# Patient Record
Sex: Female | Born: 1950 | Race: White | Hispanic: No | Marital: Married | Smoking: Former smoker
Health system: Southern US, Community
[De-identification: ages and names within clinical notes are randomized; demographics above are authoritative.]

## PROBLEM LIST (undated history)

## (undated) DIAGNOSIS — F419 Anxiety disorder, unspecified: Secondary | ICD-10-CM

## (undated) DIAGNOSIS — M1990 Unspecified osteoarthritis, unspecified site: Secondary | ICD-10-CM

## (undated) DIAGNOSIS — G35 Multiple sclerosis: Secondary | ICD-10-CM

## (undated) DIAGNOSIS — F329 Major depressive disorder, single episode, unspecified: Secondary | ICD-10-CM

## (undated) DIAGNOSIS — F32A Depression, unspecified: Secondary | ICD-10-CM

## (undated) DIAGNOSIS — G709 Myoneural disorder, unspecified: Secondary | ICD-10-CM

## (undated) HISTORY — PX: TUBAL LIGATION: SHX77

## (undated) HISTORY — PX: DENTAL SURGERY: SHX609

## (undated) HISTORY — PX: BACK SURGERY: SHX140

## (undated) HISTORY — DX: Multiple sclerosis: G35

## (undated) HISTORY — PX: BREAST EXCISIONAL BIOPSY: SUR124

## (undated) HISTORY — PX: TONSILLECTOMY: SUR1361

---

## 2011-07-23 ENCOUNTER — Other Ambulatory Visit (HOSPITAL_COMMUNITY)
Admission: RE | Admit: 2011-07-23 | Discharge: 2011-07-23 | Disposition: A | Discharge status: Home / Self Care | Payer: Medicare Other | Source: Ambulatory Visit | Attending: Family Medicine | Admitting: Family Medicine

## 2011-07-23 DIAGNOSIS — Z124 Encounter for screening for malignant neoplasm of cervix: Secondary | ICD-10-CM | POA: Insufficient documentation

## 2011-07-30 ENCOUNTER — Ambulatory Visit
Admission: RE | Admit: 2011-07-30 | Discharge: 2011-07-30 | Disposition: A | Discharge status: Home / Self Care | Payer: Medicare Other | Source: Ambulatory Visit | Attending: Family Medicine | Admitting: Family Medicine

## 2011-07-30 ENCOUNTER — Other Ambulatory Visit: Payer: Self-pay | Admitting: Family Medicine

## 2011-07-30 DIAGNOSIS — Z1231 Encounter for screening mammogram for malignant neoplasm of breast: Secondary | ICD-10-CM

## 2013-07-27 ENCOUNTER — Other Ambulatory Visit: Payer: Self-pay

## 2013-07-27 DIAGNOSIS — Z1231 Encounter for screening mammogram for malignant neoplasm of breast: Secondary | ICD-10-CM

## 2013-08-11 ENCOUNTER — Ambulatory Visit
Admission: RE | Admit: 2013-08-11 | Discharge: 2013-08-11 | Disposition: A | Discharge status: Home / Self Care | Payer: Medicare Other | Source: Ambulatory Visit

## 2013-08-11 DIAGNOSIS — Z1231 Encounter for screening mammogram for malignant neoplasm of breast: Secondary | ICD-10-CM

## 2014-08-01 ENCOUNTER — Other Ambulatory Visit: Payer: Self-pay

## 2014-08-01 ENCOUNTER — Other Ambulatory Visit (HOSPITAL_COMMUNITY)
Admission: RE | Admit: 2014-08-01 | Discharge: 2014-08-01 | Disposition: A | Discharge status: Home / Self Care | Payer: Medicare Other | Source: Ambulatory Visit | Attending: Family Medicine | Admitting: Family Medicine

## 2014-08-01 ENCOUNTER — Other Ambulatory Visit: Payer: Self-pay | Admitting: Family Medicine

## 2014-08-01 DIAGNOSIS — Z1231 Encounter for screening mammogram for malignant neoplasm of breast: Secondary | ICD-10-CM

## 2014-08-01 DIAGNOSIS — Z01419 Encounter for gynecological examination (general) (routine) without abnormal findings: Secondary | ICD-10-CM | POA: Insufficient documentation

## 2014-08-03 LAB — CYTOLOGY - PAP

## 2014-08-17 ENCOUNTER — Ambulatory Visit
Admission: RE | Admit: 2014-08-17 | Discharge: 2014-08-17 | Disposition: A | Discharge status: Home / Self Care | Payer: Medicare Other | Source: Ambulatory Visit

## 2014-08-17 DIAGNOSIS — Z1231 Encounter for screening mammogram for malignant neoplasm of breast: Secondary | ICD-10-CM

## 2014-12-27 ENCOUNTER — Encounter: Payer: Self-pay | Admitting: *Deleted

## 2015-01-17 ENCOUNTER — Ambulatory Visit: Payer: Self-pay | Admitting: Neurology

## 2015-07-06 NOTE — H&P (Signed)
TOTAL KNEE ADMISSION H&P  Patient is being admitted for right total knee arthroplasty.  Subjective:  Chief Complaint:    Right knee primary OA / pain  HPI: Barbara Singleton, 65 y.o. female, has a history of pain and functional disability in the right knee due to arthritis and has failed non-surgical conservative treatments for greater than 12 weeks to includeNSAID's and/or analgesics, corticosteriod injections, use of assistive devices and activity modification.  Onset of symptoms was gradual, starting 2+ years ago with gradually worsening course since that time. The patient noted no past surgery on the right knee(s).  Patient currently rates pain in the right knee(s) at 9 out of 10 with activity. Patient has night pain, worsening of pain with activity and weight bearing, pain that interferes with activities of daily living, pain with passive range of motion, crepitus and joint swelling.  Patient has evidence of periarticular osteophytes and joint space narrowing by imaging studies.  There is no active infection.   Risks, benefits and expectations were discussed with the patient.  Risks including but not limited to the risk of anesthesia, blood clots, nerve damage, blood vessel damage, failure of the prosthesis, infection and up to and including death.  Patient understand the risks, benefits and expectations and wishes to proceed with surgery.   PCP: No primary care provider on file.  D/C Plans:      Home with HHPT  Post-op Meds:       No Rx given   Tranexamic Acid:      To be given - IV   Decadron:      Is to be given  FYI:     ASA  Norco    Past Medical History  Diagnosis Date  . Multiple sclerosis     Past Surgical History  Procedure Laterality Date  . Back surgery    . Cesarean section    . Dental surgery    . Tonsillectomy    . Tubal ligation      No prescriptions prior to admission   No Known Allergies   Social History  Substance Use Topics  . Smoking status: Not on  file  . Smokeless tobacco: Not on file  . Alcohol Use: Not on file       Review of Systems  Constitutional: Negative.   HENT: Negative.   Eyes: Negative.   Respiratory: Negative.   Cardiovascular: Negative.   Gastrointestinal: Negative.   Genitourinary: Negative.   Musculoskeletal: Positive for back pain and joint pain.  Skin: Negative.   Neurological: Negative.   Endo/Heme/Allergies: Negative.   Psychiatric/Behavioral: Negative.     Objective:  Physical Exam  Constitutional: She is oriented to person, place, and time. She appears well-developed.  HENT:  Head: Normocephalic.  Eyes: Pupils are equal, round, and reactive to light.  Neck: Neck supple. No JVD present. No tracheal deviation present. No thyromegaly present.  Cardiovascular: Normal rate, regular rhythm, normal heart sounds and intact distal pulses.   Respiratory: Effort normal and breath sounds normal. No stridor. No respiratory distress. She has no wheezes.  GI: Soft. There is no tenderness. There is no guarding.  Musculoskeletal:       Right knee: She exhibits decreased range of motion, swelling, abnormal alignment and bony tenderness. She exhibits no deformity, no laceration and no erythema. Tenderness found.  Lymphadenopathy:    She has no cervical adenopathy.  Neurological: She is alert and oriented to person, place, and time. A sensory deficit (occassionally tingling do to  her MS) is present.  Skin: Skin is warm and dry.  Psychiatric: She has a normal mood and affect.      Imaging Review Plain radiographs demonstrate severe degenerative joint disease of the right knee(s). The overall alignment is significant valgus. The bone quality appears to be good for age and reported activity level.  Assessment/Plan:  End stage arthritis, right knee   The patient history, physical examination, clinical judgment of the provider and imaging studies are consistent with end stage degenerative joint disease of the  right knee(s) and total knee arthroplasty is deemed medically necessary. The treatment options including medical management, injection therapy arthroscopy and arthroplasty were discussed at length. The risks and benefits of total knee arthroplasty were presented and reviewed. The risks due to aseptic loosening, infection, stiffness, patella tracking problems, thromboembolic complications and other imponderables were discussed. The patient acknowledged the explanation, agreed to proceed with the plan and consent was signed. Patient is being admitted for inpatient treatment for surgery, pain control, PT, OT, prophylactic antibiotics, VTE prophylaxis, progressive ambulation and ADL's and discharge planning. The patient is planning to be discharged home with home health services.      Anastasio Auerbach Renessa Wellnitz   PA-C  07/06/2015, 9:05 AM

## 2015-07-10 ENCOUNTER — Encounter (HOSPITAL_COMMUNITY): Payer: Self-pay

## 2015-07-10 ENCOUNTER — Encounter (HOSPITAL_COMMUNITY)
Admission: RE | Admit: 2015-07-10 | Discharge: 2015-07-10 | Disposition: A | Discharge status: Home / Self Care | Payer: Medicare Other | Source: Ambulatory Visit | Attending: Orthopedic Surgery | Admitting: Orthopedic Surgery

## 2015-07-10 DIAGNOSIS — M1711 Unilateral primary osteoarthritis, right knee: Secondary | ICD-10-CM | POA: Diagnosis not present

## 2015-07-10 DIAGNOSIS — Z0183 Encounter for blood typing: Secondary | ICD-10-CM | POA: Diagnosis not present

## 2015-07-10 DIAGNOSIS — Z01812 Encounter for preprocedural laboratory examination: Secondary | ICD-10-CM | POA: Insufficient documentation

## 2015-07-10 HISTORY — DX: Major depressive disorder, single episode, unspecified: F32.9

## 2015-07-10 HISTORY — DX: Depression, unspecified: F32.A

## 2015-07-10 HISTORY — DX: Myoneural disorder, unspecified: G70.9

## 2015-07-10 HISTORY — DX: Unspecified osteoarthritis, unspecified site: M19.90

## 2015-07-10 LAB — URINALYSIS, ROUTINE W REFLEX MICROSCOPIC
Bilirubin Urine: NEGATIVE
Glucose, UA: NEGATIVE mg/dL
Hgb urine dipstick: NEGATIVE
Ketones, ur: NEGATIVE mg/dL
LEUKOCYTES UA: NEGATIVE
Nitrite: NEGATIVE
PROTEIN: NEGATIVE mg/dL
Specific Gravity, Urine: 1.022 (ref 1.005–1.030)
pH: 7 (ref 5.0–8.0)

## 2015-07-10 LAB — BASIC METABOLIC PANEL
Anion gap: 9 (ref 5–15)
BUN: 17 mg/dL (ref 6–20)
CHLORIDE: 105 mmol/L (ref 101–111)
CO2: 26 mmol/L (ref 22–32)
CREATININE: 0.59 mg/dL (ref 0.44–1.00)
Calcium: 9.9 mg/dL (ref 8.9–10.3)
GFR calc Af Amer: >60 mL/min (ref >60)
GLUCOSE: 98 mg/dL (ref 65–99)
POTASSIUM: 4.3 mmol/L (ref 3.5–5.1)
Sodium: 140 mmol/L (ref 135–145)

## 2015-07-10 LAB — SURGICAL PCR SCREEN
MRSA, PCR: NEGATIVE
Staphylococcus aureus: POSITIVE — AB

## 2015-07-10 LAB — PROTIME-INR
INR: 1.07 (ref 0.00–1.49)
Prothrombin Time: 13.7 seconds (ref 11.6–15.2)

## 2015-07-10 LAB — CBC
HEMATOCRIT: 41 % (ref 36.0–46.0)
Hemoglobin: 13.1 g/dL (ref 12.0–15.0)
MCH: 28.9 pg (ref 26.0–34.0)
MCHC: 32 g/dL (ref 30.0–36.0)
MCV: 90.5 fL (ref 78.0–100.0)
Platelets: 250 10*3/uL (ref 150–400)
RBC: 4.53 MIL/uL (ref 3.87–5.11)
RDW: 14 % (ref 11.5–15.5)
WBC: 6 10*3/uL (ref 4.0–10.5)

## 2015-07-10 LAB — APTT: APTT: 26 s (ref 24–37)

## 2015-07-10 LAB — ABO/RH: ABO/RH(D): B POS

## 2015-07-10 NOTE — Pre-Procedure Instructions (Addendum)
EKG 06-30-15, Dr. Ehinger(Clearance note) with chart.Clearance note(Dr. Tinnie Gens with chart 07-05-15.

## 2015-07-10 NOTE — Patient Instructions (Signed)
Barbara Singleton  07/10/2015   Your procedure is scheduled on: 07-18-15   Report to [facility] Main  Entrance take [facility]  elevators to 3rd floor to  Short Stay Center at   1000 AM.  Call this number if you have problems the morning of surgery [phone]   Remember: ONLY 1 PERSON MAY GO WITH YOU TO SHORT STAY TO GET  READY MORNING OF YOUR SURGERY.  Do not eat food or drink liquids :After Midnight.     Take these medicines the morning of surgery with A SIP OF WATER:  Luvox. Lamictal. Latuda. Copaxone. Hydrocodone. DO NOT TAKE ANY DIABETIC MEDICATIONS DAY OF YOUR SURGERY                               You may not have any metal on your body including hair pins and              piercings  Do not wear jewelry, make-up, lotions, powders or perfumes, deodorant             Do not wear nail polish.  Do not shave  48 hours prior to surgery.              Men may shave face and neck.   Do not bring valuables to the hospital. [name] IS NOT             RESPONSIBLE   FOR VALUABLES.  Contacts, dentures or bridgework may not be worn into surgery.  Leave suitcase in the car. After surgery it may be brought to your room.     Patients discharged the day of surgery will not be allowed to drive home.  Name and phone number of your driver: Barbara Singleton -spouse [phone] home  Special Instructions: N/A              Please read over the following fact sheets you were given: _____________________________________________________________________             [facility] - Preparing for Surgery Before surgery, you can play an important role.  Because skin is not sterile, your skin needs to be as free of germs as possible.  You can reduce the number of germs on your skin by washing with CHG (chlorahexidine gluconate) soap before surgery.  CHG is an antiseptic cleaner which kills germs and bonds with the skin to continue killing germs even after washing. Please DO NOT use if you have an  allergy to CHG or antibacterial soaps.  If your skin becomes reddened/irritated stop using the CHG and inform your nurse when you arrive at Short Stay. Do not shave (including legs and underarms) for at least 48 hours prior to the first CHG shower.  You may shave your face/neck. Please follow these instructions carefully:  1.  Shower with CHG Soap the night before surgery and the  morning of Surgery.  2.  If you choose to wash your hair, wash your hair first as usual with your  normal  shampoo.  3.  After you shampoo, rinse your hair and body thoroughly to remove the  shampoo.                           4.  Use CHG as you would any other liquid soap.  You can apply chg directly  to the skin and wash                       Gently with a scrungie or clean washcloth.  5.  Apply the CHG Soap to your body ONLY FROM THE NECK DOWN.   Do not use on face/ open                           Wound or open sores. Avoid contact with eyes, ears mouth and genitals (private parts).                       Wash face,  Genitals (private parts) with your normal soap.             6.  Wash thoroughly, paying special attention to the area where your surgery  will be performed.  7.  Thoroughly rinse your body with warm water from the neck down.  8.  DO NOT shower/wash with your normal soap after using and rinsing off  the CHG Soap.                9.  Pat yourself dry with a clean towel.            10.  Wear clean pajamas.            11.  Place clean sheets on your bed the night of your first shower and do not  sleep with pets. Day of Surgery : Do not apply any lotions/deodorants the morning of surgery.  Please wear clean clothes to the hospital/surgery center.  FAILURE TO FOLLOW THESE INSTRUCTIONS MAY RESULT IN THE CANCELLATION OF YOUR SURGERY PATIENT SIGNATURE_________________________________  NURSE  SIGNATURE__________________________________  ________________________________________________________________________   Barbara Singleton  An incentive spirometer is a tool that can help keep your lungs clear and active. This tool measures how well you are filling your lungs with each breath. Taking long deep breaths may help reverse or decrease the chance of developing breathing (pulmonary) problems (especially infection) following:  A long period of time when you are unable to move or be active. BEFORE THE PROCEDURE   If the spirometer includes an indicator to show your best effort, your nurse or respiratory therapist will set it to a desired goal.  If possible, sit up straight or lean slightly forward. Try not to slouch.  Hold the incentive spirometer in an upright position. INSTRUCTIONS FOR USE   Sit on the edge of your bed if possible, or sit up as far as you can in bed or on a chair.  Hold the incentive spirometer in an upright position.  Breathe out normally.  Place the mouthpiece in your mouth and seal your lips tightly around it.  Breathe in slowly and as deeply as possible, raising the piston or the ball toward the top of the column.  Hold your breath for 3-5 seconds or for as long as possible. Allow the piston or ball to fall to the bottom of the column.  Remove the mouthpiece from your mouth and breathe out normally.  Rest for a few seconds and repeat Steps 1 through 7 at least 10 times every 1-2 hours when you are awake. Take your time and take a few normal breaths between deep breaths.  The spirometer may include an indicator to show your best effort. Use the indicator as a goal to work toward  during each repetition.  After each set of 10 deep breaths, practice coughing to be sure your lungs are clear. If you have an incision (the cut made at the time of surgery), support your incision when coughing by placing a pillow or rolled up towels firmly against it. Once  you are able to get out of bed, walk around indoors and cough well. You may stop using the incentive spirometer when instructed by your caregiver.  RISKS AND COMPLICATIONS  Take your time so you do not get dizzy or light-headed.  If you are in pain, you may need to take or ask for pain medication before doing incentive spirometry. It is harder to take a deep breath if you are having pain. AFTER USE  Rest and breathe slowly and easily.  It can be helpful to keep track of a log of your progress. Your caregiver can provide you with a simple table to help with this. If you are using the spirometer at home, follow these instructions: [place] IF:   You are having difficultly using the spirometer.  You have trouble using the spirometer as often as instructed.  Your pain medication is not giving enough relief while using the spirometer.  You develop fever of 100.5 F (38.1 C) or higher. SEEK IMMEDIATE MEDICAL CARE IF:   You cough up bloody sputum that had not been present before.  You develop fever of 102 F (38.9 C) or greater.  You develop worsening pain at or near the incision site. MAKE SURE YOU:   Understand these instructions.  Will watch your condition.  Will get help right away if you are not doing well or get worse. Document Released: 09/09/2006 Document Revised: 07/22/2011 Document Reviewed: 11/10/2006 Exit[employer] Patient Information 2014 [employer], [place].   ________________________________________________________________________  WHAT IS A BLOOD TRANSFUSION? Blood Transfusion Information  A transfusion is the replacement of blood or some of its parts. Blood is made up of multiple cells which provide different functions.  Red blood cells carry oxygen and are used for blood loss replacement.  White blood cells fight against infection.  Platelets control bleeding.  Plasma helps clot blood.  Other blood products are available for specialized needs, such as  hemophilia or other clotting disorders. BEFORE THE TRANSFUSION  Who gives blood for transfusions?   Healthy volunteers who are fully evaluated to make sure their blood is safe. This is blood bank blood. Transfusion therapy is the safest it has ever been in the practice of medicine. Before blood is taken from a donor, a complete history is taken to make sure that person has no history of diseases nor engages in risky social behavior (examples are intravenous drug use or sexual activity with multiple partners). The donor's travel history is screened to minimize risk of transmitting infections, such as malaria. The donated blood is tested for signs of infectious diseases, such as HIV and hepatitis. The blood is then tested to be sure it is compatible with you in order to minimize the chance of a transfusion reaction. If you or a relative donates blood, this is often done in anticipation of surgery and is not appropriate for emergency situations. It takes many days to process the donated blood. RISKS AND COMPLICATIONS Although transfusion therapy is very safe and saves many lives, the main dangers of transfusion include:   Getting an infectious disease.  Developing a transfusion reaction. This is an allergic reaction to something in the blood you were given. Every precaution is taken to  prevent this. The decision to have a blood transfusion has been considered carefully by your caregiver before blood is given. Blood is not given unless the benefits outweigh the risks. AFTER THE TRANSFUSION  Right after receiving a blood transfusion, you will usually feel much better and more energetic. This is especially true if your red blood cells have gotten low (anemic). The transfusion raises the level of the red blood cells which carry oxygen, and this usually causes an energy increase.  The nurse administering the transfusion will monitor you carefully for complications. HOME CARE INSTRUCTIONS  No special  instructions are needed after a transfusion. You may find your energy is better. Speak with your caregiver about any limitations on activity for underlying diseases you may have. SEEK MEDICAL CARE IF:   Your condition is not improving after your transfusion.  You develop redness or irritation at the intravenous (IV) site. SEEK IMMEDIATE MEDICAL CARE IF:  Any of the following symptoms occur over the next 12 hours:  Shaking chills.  You have a temperature by mouth above 102 F (38.9 C), not controlled by medicine.  Chest, back, or muscle pain.  People around you feel you are not acting correctly or are confused.  Shortness of breath or difficulty breathing.  Dizziness and fainting.  You get a rash or develop hives.  You have a decrease in urine output.  Your urine turns a dark color or changes to pink, red, or brown. Any of the following symptoms occur over the next 10 days:  You have a temperature by mouth above 102 F (38.9 C), not controlled by medicine.  Shortness of breath.  Weakness after normal activity.  The white part of the eye turns yellow (jaundice).  You have a decrease in the amount of urine or are urinating less often.  Your urine turns a dark color or changes to pink, red, or brown. Document Released: 04/26/2000 Document Revised: 07/22/2011 Document Reviewed: 12/14/2007 [facility] Patient Information 2014 [place], [place].  _______________________________________________________________________

## 2015-07-11 NOTE — Pre-Procedure Instructions (Addendum)
07-11-15 Positive Staph aureus by PCR- to use Mupirocin Ointment as directed, RX. Called to [facility] [address] [phone]. Fax note to Dr. Charlann Boxer office [phone].

## 2015-07-18 ENCOUNTER — Inpatient Hospital Stay (HOSPITAL_COMMUNITY)
Admission: RE | Admit: 2015-07-18 | Discharge: 2015-07-19 | DRG: 470 | Disposition: A | Discharge status: Home Health | Payer: Medicare Other | Source: Ambulatory Visit | Attending: Orthopedic Surgery | Admitting: Orthopedic Surgery

## 2015-07-18 ENCOUNTER — Encounter (HOSPITAL_COMMUNITY): Payer: Self-pay | Admitting: *Deleted

## 2015-07-18 ENCOUNTER — Encounter (HOSPITAL_COMMUNITY)
Admission: RE | Disposition: A | Discharge status: Home Health | Payer: Self-pay | Source: Ambulatory Visit | Attending: Orthopedic Surgery

## 2015-07-18 ENCOUNTER — Inpatient Hospital Stay (HOSPITAL_COMMUNITY): Payer: Medicare Other | Admitting: Certified Registered"

## 2015-07-18 DIAGNOSIS — Z96659 Presence of unspecified artificial knee joint: Secondary | ICD-10-CM

## 2015-07-18 DIAGNOSIS — Z96651 Presence of right artificial knee joint: Secondary | ICD-10-CM

## 2015-07-18 DIAGNOSIS — E663 Overweight: Secondary | ICD-10-CM | POA: Diagnosis present

## 2015-07-18 DIAGNOSIS — M659 Synovitis and tenosynovitis, unspecified: Secondary | ICD-10-CM | POA: Diagnosis present

## 2015-07-18 DIAGNOSIS — Z6828 Body mass index (BMI) 28.0-28.9, adult: Secondary | ICD-10-CM

## 2015-07-18 DIAGNOSIS — M1711 Unilateral primary osteoarthritis, right knee: Secondary | ICD-10-CM | POA: Diagnosis present

## 2015-07-18 DIAGNOSIS — G35 Multiple sclerosis: Secondary | ICD-10-CM | POA: Diagnosis present

## 2015-07-18 DIAGNOSIS — Z01812 Encounter for preprocedural laboratory examination: Secondary | ICD-10-CM

## 2015-07-18 DIAGNOSIS — M25561 Pain in right knee: Secondary | ICD-10-CM | POA: Diagnosis present

## 2015-07-18 HISTORY — PX: TOTAL KNEE ARTHROPLASTY: SHX125

## 2015-07-18 LAB — TYPE AND SCREEN
ABO/RH(D): B POS
Antibody Screen: NEGATIVE

## 2015-07-18 SURGERY — ARTHROPLASTY, KNEE, TOTAL
Anesthesia: Regional | Laterality: Right

## 2015-07-18 MED ORDER — LURASIDONE HCL 20 MG PO TABS
20.0000 mg | ORAL_TABLET | Freq: Every morning | ORAL | Status: DC
  Administered 2015-07-19: 20 mg via ORAL
  Filled 2015-07-18: qty 1

## 2015-07-18 MED ORDER — FENTANYL CITRATE (PF) 100 MCG/2ML IJ SOLN: 25.0000 ug | INTRAMUSCULAR | Status: DC | PRN

## 2015-07-18 MED ORDER — FENTANYL CITRATE (PF) 100 MCG/2ML IJ SOLN
25.0000 ug | INTRAMUSCULAR | Status: DC | PRN
  Administered 2015-07-18 (×3): 50 ug via INTRAVENOUS

## 2015-07-18 MED ORDER — KETOROLAC TROMETHAMINE 30 MG/ML IJ SOLN
INTRAMUSCULAR | Status: AC
Start: 2015-07-18 — End: 2015-07-18
  Filled 2015-07-18: qty 1

## 2015-07-18 MED ORDER — METHYLPHENIDATE HCL 10 MG PO TABS: 10.0000 mg | ORAL_TABLET | Freq: Two times a day (BID) | ORAL | Status: DC

## 2015-07-18 MED ORDER — KETOROLAC TROMETHAMINE 30 MG/ML IJ SOLN
INTRAMUSCULAR | Status: DC | PRN
  Administered 2015-07-18: 30 mg

## 2015-07-18 MED ORDER — MIDAZOLAM HCL 2 MG/2ML IJ SOLN
INTRAMUSCULAR | Status: AC
  Filled 2015-07-18: qty 2

## 2015-07-18 MED ORDER — PROPOFOL 10 MG/ML IV BOLUS
INTRAVENOUS | Status: AC
  Filled 2015-07-18: qty 80

## 2015-07-18 MED ORDER — PROPOFOL 500 MG/50ML IV EMUL
INTRAVENOUS | Status: DC | PRN
  Administered 2015-07-18: 75 ug/kg/min via INTRAVENOUS

## 2015-07-18 MED ORDER — SODIUM CHLORIDE 0.9 % IR SOLN
Status: DC | PRN
  Administered 2015-07-18: 1000 mL

## 2015-07-18 MED ORDER — HYDROCODONE-ACETAMINOPHEN 7.5-325 MG PO TABS
1.0000 | ORAL_TABLET | ORAL | Status: DC
  Administered 2015-07-18 – 2015-07-19 (×2): 2 via ORAL
  Filled 2015-07-18 (×4): qty 2

## 2015-07-18 MED ORDER — BUPIVACAINE-EPINEPHRINE (PF) 0.25% -1:200000 IJ SOLN
INTRAMUSCULAR | Status: AC
Start: 2015-07-18 — End: 2015-07-18
  Filled 2015-07-18: qty 30

## 2015-07-18 MED ORDER — 0.9 % SODIUM CHLORIDE (POUR BTL) OPTIME
TOPICAL | Status: DC | PRN
  Administered 2015-07-18: 1000 mL

## 2015-07-18 MED ORDER — BUPIVACAINE-EPINEPHRINE (PF) 0.5% -1:200000 IJ SOLN
INTRAMUSCULAR | Status: AC
  Filled 2015-07-18: qty 30

## 2015-07-18 MED ORDER — BUPIVACAINE-EPINEPHRINE (PF) 0.25% -1:200000 IJ SOLN
INTRAMUSCULAR | Status: DC | PRN
  Administered 2015-07-18: 30 mL

## 2015-07-18 MED ORDER — BISACODYL 10 MG RE SUPP: 10.0000 mg | Freq: Every day | RECTAL | Status: DC | PRN

## 2015-07-18 MED ORDER — FENTANYL CITRATE (PF) 100 MCG/2ML IJ SOLN
INTRAMUSCULAR | Status: DC | PRN
  Administered 2015-07-18 (×2): 50 ug via INTRAVENOUS

## 2015-07-18 MED ORDER — METOCLOPRAMIDE HCL 10 MG PO TABS: 5.0000 mg | ORAL_TABLET | Freq: Three times a day (TID) | ORAL | Status: DC | PRN

## 2015-07-18 MED ORDER — ONDANSETRON HCL 4 MG/2ML IJ SOLN
INTRAMUSCULAR | Status: DC | PRN
  Administered 2015-07-18: 4 mg via INTRAVENOUS

## 2015-07-18 MED ORDER — ALUM & MAG HYDROXIDE-SIMETH 200-200-20 MG/5ML PO SUSP: 30.0000 mL | ORAL | Status: DC | PRN

## 2015-07-18 MED ORDER — MAGNESIUM CITRATE PO SOLN: 1.0000 | Freq: Once | ORAL | Status: DC | PRN

## 2015-07-18 MED ORDER — HYDROMORPHONE HCL 1 MG/ML IJ SOLN
INTRAMUSCULAR | Status: AC
  Filled 2015-07-18: qty 1

## 2015-07-18 MED ORDER — PROMETHAZINE HCL 25 MG/ML IJ SOLN: 6.2500 mg | INTRAMUSCULAR | Status: DC | PRN

## 2015-07-18 MED ORDER — DOCUSATE SODIUM 100 MG PO CAPS
100.0000 mg | ORAL_CAPSULE | Freq: Two times a day (BID) | ORAL | Status: DC
  Administered 2015-07-18 – 2015-07-19 (×2): 100 mg via ORAL

## 2015-07-18 MED ORDER — TRANEXAMIC ACID 1000 MG/10ML IV SOLN
1000.0000 mg | Freq: Once | INTRAVENOUS | Status: AC
  Administered 2015-07-18: 1000 mg via INTRAVENOUS
  Filled 2015-07-18: qty 10

## 2015-07-18 MED ORDER — SODIUM CHLORIDE 0.9 % IJ SOLN
INTRAMUSCULAR | Status: AC
  Filled 2015-07-18: qty 50

## 2015-07-18 MED ORDER — FERROUS SULFATE 325 (65 FE) MG PO TABS
325.0000 mg | ORAL_TABLET | Freq: Three times a day (TID) | ORAL | Status: DC
  Administered 2015-07-19: 325 mg via ORAL
  Filled 2015-07-18 (×4): qty 1

## 2015-07-18 MED ORDER — CHLORHEXIDINE GLUCONATE 4 % EX LIQD: 60.0000 mL | Freq: Once | CUTANEOUS | Status: DC

## 2015-07-18 MED ORDER — HYDROMORPHONE HCL 1 MG/ML IJ SOLN
0.2500 mg | INTRAMUSCULAR | Status: DC | PRN
  Administered 2015-07-18: 0.25 mg via INTRAVENOUS
  Administered 2015-07-18: 0.5 mg via INTRAVENOUS
  Administered 2015-07-18: 0.25 mg via INTRAVENOUS
  Administered 2015-07-18 (×2): 0.5 mg via INTRAVENOUS

## 2015-07-18 MED ORDER — PHENOL 1.4 % MT LIQD: 1.0000 | OROMUCOSAL | Status: DC | PRN

## 2015-07-18 MED ORDER — LIP MEDEX EX OINT
TOPICAL_OINTMENT | CUTANEOUS | Status: AC
  Filled 2015-07-18: qty 7

## 2015-07-18 MED ORDER — LACTATED RINGERS IV SOLN
INTRAVENOUS | Status: DC
  Administered 2015-07-18: 1000 mL via INTRAVENOUS
  Administered 2015-07-18: 14:00:00 via INTRAVENOUS
  Administered 2015-07-18: 1000 mL via INTRAVENOUS

## 2015-07-18 MED ORDER — FENTANYL CITRATE (PF) 100 MCG/2ML IJ SOLN
INTRAMUSCULAR | Status: AC
  Filled 2015-07-18: qty 2

## 2015-07-18 MED ORDER — CEFAZOLIN SODIUM-DEXTROSE 2-3 GM-% IV SOLR
INTRAVENOUS | Status: AC
  Filled 2015-07-18: qty 50

## 2015-07-18 MED ORDER — HYDROMORPHONE HCL 1 MG/ML IJ SOLN
0.5000 mg | INTRAMUSCULAR | Status: DC | PRN
  Administered 2015-07-18: 1 mg via INTRAVENOUS
  Administered 2015-07-18: 0.5 mg via INTRAVENOUS
  Filled 2015-07-18: qty 1

## 2015-07-18 MED ORDER — DEXAMETHASONE SODIUM PHOSPHATE 10 MG/ML IJ SOLN
10.0000 mg | Freq: Once | INTRAMUSCULAR | Status: DC
  Filled 2015-07-18: qty 1

## 2015-07-18 MED ORDER — CELECOXIB 200 MG PO CAPS
200.0000 mg | ORAL_CAPSULE | Freq: Two times a day (BID) | ORAL | Status: DC
  Administered 2015-07-19: 200 mg via ORAL
  Filled 2015-07-18 (×3): qty 1

## 2015-07-18 MED ORDER — TRAZODONE HCL 50 MG PO TABS
50.0000 mg | ORAL_TABLET | Freq: Every day | ORAL | Status: DC
  Filled 2015-07-18: qty 1

## 2015-07-18 MED ORDER — ACETAMINOPHEN 325 MG PO TABS
ORAL_TABLET | ORAL | Status: DC | PRN
  Administered 2015-07-18: 1000 mg via ORAL

## 2015-07-18 MED ORDER — PROPOFOL 10 MG/ML IV BOLUS
INTRAVENOUS | Status: DC | PRN
  Administered 2015-07-18: 40 mg via INTRAVENOUS
  Administered 2015-07-18: 50 mg via INTRAVENOUS
  Administered 2015-07-18: 150 mg via INTRAVENOUS
  Administered 2015-07-18: 50 mg via INTRAVENOUS

## 2015-07-18 MED ORDER — DEXAMETHASONE SODIUM PHOSPHATE 10 MG/ML IJ SOLN
INTRAMUSCULAR | Status: AC
  Filled 2015-07-18: qty 1

## 2015-07-18 MED ORDER — SODIUM CHLORIDE 0.9 % IV SOLN
INTRAVENOUS | Status: DC
  Filled 2015-07-18 (×5): qty 1000

## 2015-07-18 MED ORDER — SODIUM CHLORIDE 0.9 % IJ SOLN
INTRAMUSCULAR | Status: DC | PRN
  Administered 2015-07-18: 30 mL

## 2015-07-18 MED ORDER — POLYETHYLENE GLYCOL 3350 17 G PO PACK
17.0000 g | PACK | Freq: Two times a day (BID) | ORAL | Status: DC
  Administered 2015-07-19: 17 g via ORAL

## 2015-07-18 MED ORDER — CEFAZOLIN SODIUM-DEXTROSE 2-3 GM-% IV SOLR
2.0000 g | Freq: Four times a day (QID) | INTRAVENOUS | Status: AC
  Administered 2015-07-18 (×2): 2 g via INTRAVENOUS
  Filled 2015-07-18 (×2): qty 50

## 2015-07-18 MED ORDER — METOCLOPRAMIDE HCL 5 MG/ML IJ SOLN: 5.0000 mg | Freq: Three times a day (TID) | INTRAMUSCULAR | Status: DC | PRN

## 2015-07-18 MED ORDER — LIDOCAINE HCL (CARDIAC) 20 MG/ML IV SOLN
INTRAVENOUS | Status: DC | PRN
  Administered 2015-07-18: 5 mL via INTRATRACHEAL

## 2015-07-18 MED ORDER — LAMOTRIGINE 100 MG PO TABS
100.0000 mg | ORAL_TABLET | Freq: Two times a day (BID) | ORAL | Status: DC
  Administered 2015-07-18 – 2015-07-19 (×2): 100 mg via ORAL
  Filled 2015-07-18 (×3): qty 1

## 2015-07-18 MED ORDER — PHENYLEPHRINE 40 MCG/ML (10ML) SYRINGE FOR IV PUSH (FOR BLOOD PRESSURE SUPPORT)
PREFILLED_SYRINGE | INTRAVENOUS | Status: AC
  Filled 2015-07-18: qty 10

## 2015-07-18 MED ORDER — METHYLPHENIDATE HCL 5 MG PO TABS
10.0000 mg | ORAL_TABLET | Freq: Two times a day (BID) | ORAL | Status: DC
  Administered 2015-07-18 – 2015-07-19 (×2): 10 mg via ORAL
  Filled 2015-07-18 (×2): qty 2

## 2015-07-18 MED ORDER — FLUVOXAMINE MALEATE 100 MG PO TABS
100.0000 mg | ORAL_TABLET | Freq: Three times a day (TID) | ORAL | Status: DC
  Administered 2015-07-18 – 2015-07-19 (×2): 100 mg via ORAL
  Filled 2015-07-18 (×4): qty 1

## 2015-07-18 MED ORDER — ACETAMINOPHEN 500 MG PO TABS
ORAL_TABLET | ORAL | Status: AC
  Filled 2015-07-18: qty 2

## 2015-07-18 MED ORDER — METHOCARBAMOL 1000 MG/10ML IJ SOLN
500.0000 mg | Freq: Four times a day (QID) | INTRAVENOUS | Status: DC | PRN
  Administered 2015-07-18: 500 mg via INTRAVENOUS
  Filled 2015-07-18 (×2): qty 5

## 2015-07-18 MED ORDER — DEXAMETHASONE SODIUM PHOSPHATE 10 MG/ML IJ SOLN
10.0000 mg | Freq: Once | INTRAMUSCULAR | Status: AC
  Administered 2015-07-18: 10 mg via INTRAVENOUS

## 2015-07-18 MED ORDER — STERILE WATER FOR IRRIGATION IR SOLN
Status: DC | PRN
  Administered 2015-07-18: 2000 mL

## 2015-07-18 MED ORDER — ASPIRIN EC 325 MG PO TBEC
325.0000 mg | DELAYED_RELEASE_TABLET | Freq: Two times a day (BID) | ORAL | Status: DC
  Administered 2015-07-19: 325 mg via ORAL
  Filled 2015-07-18 (×3): qty 1

## 2015-07-18 MED ORDER — MENTHOL 3 MG MT LOZG: 1.0000 | LOZENGE | OROMUCOSAL | Status: DC | PRN

## 2015-07-18 MED ORDER — BUPIVACAINE-EPINEPHRINE (PF) 0.5% -1:200000 IJ SOLN
INTRAMUSCULAR | Status: DC | PRN
  Administered 2015-07-18: 30 mL via PERINEURAL

## 2015-07-18 MED ORDER — CEFAZOLIN SODIUM-DEXTROSE 2-3 GM-% IV SOLR
2.0000 g | INTRAVENOUS | Status: AC
  Administered 2015-07-18: 2 g via INTRAVENOUS

## 2015-07-18 MED ORDER — MIDAZOLAM HCL 2 MG/2ML IJ SOLN
INTRAMUSCULAR | Status: DC | PRN
  Administered 2015-07-18 (×2): 2 mg via INTRAVENOUS

## 2015-07-18 MED ORDER — ONDANSETRON HCL 4 MG/2ML IJ SOLN
INTRAMUSCULAR | Status: AC
  Filled 2015-07-18: qty 2

## 2015-07-18 MED ORDER — ONDANSETRON HCL 4 MG PO TABS: 4.0000 mg | ORAL_TABLET | Freq: Four times a day (QID) | ORAL | Status: DC | PRN

## 2015-07-18 MED ORDER — ONDANSETRON HCL 4 MG/2ML IJ SOLN: 4.0000 mg | Freq: Four times a day (QID) | INTRAMUSCULAR | Status: DC | PRN

## 2015-07-18 MED ORDER — METHOCARBAMOL 500 MG PO TABS
500.0000 mg | ORAL_TABLET | Freq: Four times a day (QID) | ORAL | Status: DC | PRN
  Administered 2015-07-19: 500 mg via ORAL
  Filled 2015-07-18 (×2): qty 1

## 2015-07-18 SURGICAL SUPPLY — 46 items
BANDAGE ACE 6X5 VEL STRL LF (GAUZE/BANDAGES/DRESSINGS) ×3 IMPLANT
BLADE SAW SGTL 13.0X1.19X90.0M (BLADE) ×3 IMPLANT
BOWL SMART MIX CTS (DISPOSABLE) ×3 IMPLANT
CAPT KNEE TOTAL 3 ATTUNE ×3 IMPLANT
CEMENT HV SMART SET (Cement) ×6 IMPLANT
CLOTH BEACON ORANGE TIMEOUT ST (SAFETY) ×3 IMPLANT
CUFF TOURN SGL QUICK 34 (TOURNIQUET CUFF) ×2
CUFF TRNQT CYL 34X4X40X1 (TOURNIQUET CUFF) ×1 IMPLANT
DECANTER SPIKE VIAL GLASS SM (MISCELLANEOUS) ×3 IMPLANT
DRAPE U-SHAPE 47X51 STRL (DRAPES) ×3 IMPLANT
DRSG AQUACEL AG ADV 3.5X10 (GAUZE/BANDAGES/DRESSINGS) ×3 IMPLANT
DURAPREP 26ML APPLICATOR (WOUND CARE) ×6 IMPLANT
ELECT REM PT RETURN 9FT ADLT (ELECTROSURGICAL) ×3
ELECTRODE REM PT RTRN 9FT ADLT (ELECTROSURGICAL) ×1 IMPLANT
GLOVE BIOGEL M 7.0 STRL (GLOVE) ×3 IMPLANT
GLOVE BIOGEL PI IND STRL 7.5 (GLOVE) ×3 IMPLANT
GLOVE BIOGEL PI IND STRL 8.5 (GLOVE) ×1 IMPLANT
GLOVE BIOGEL PI INDICATOR 7.5 (GLOVE) ×6
GLOVE BIOGEL PI INDICATOR 8.5 (GLOVE) ×2
GLOVE ECLIPSE 8.0 STRL XLNG CF (GLOVE) ×3 IMPLANT
GLOVE ORTHO TXT STRL SZ7.5 (GLOVE) ×6 IMPLANT
GLOVE SURG SIGNA 7.5 PF LTX (GLOVE) ×3 IMPLANT
GLOVE SURG SS PI 7.5 STRL IVOR (GLOVE) ×3 IMPLANT
GOWN STRL REUS W/TWL LRG LVL3 (GOWN DISPOSABLE) ×6 IMPLANT
GOWN STRL REUS W/TWL XL LVL3 (GOWN DISPOSABLE) ×9 IMPLANT
HANDPIECE INTERPULSE COAX TIP (DISPOSABLE) ×2
LIQUID BAND (GAUZE/BANDAGES/DRESSINGS) ×3 IMPLANT
MANIFOLD NEPTUNE II (INSTRUMENTS) ×3 IMPLANT
NDL SAFETY ECLIPSE 18X1.5 (NEEDLE) ×1 IMPLANT
NEEDLE HYPO 18GX1.5 SHARP (NEEDLE) ×2
PACK TOTAL KNEE CUSTOM (KITS) ×3 IMPLANT
POSITIONER SURGICAL ARM (MISCELLANEOUS) ×3 IMPLANT
SET HNDPC FAN SPRY TIP SCT (DISPOSABLE) ×1 IMPLANT
SET PAD KNEE POSITIONER (MISCELLANEOUS) ×3 IMPLANT
SUCTION FRAZIER HANDLE 12FR (TUBING) ×2
SUCTION TUBE FRAZIER 12FR DISP (TUBING) ×1 IMPLANT
SUT MNCRL AB 4-0 PS2 18 (SUTURE) ×3 IMPLANT
SUT VIC AB 1 CT1 36 (SUTURE) ×3 IMPLANT
SUT VIC AB 2-0 CT1 27 (SUTURE) ×6
SUT VIC AB 2-0 CT1 TAPERPNT 27 (SUTURE) ×3 IMPLANT
SUT VLOC 180 0 24IN GS25 (SUTURE) ×3 IMPLANT
SYR 3ML LL SCALE MARK (SYRINGE) ×3 IMPLANT
SYR 50ML LL SCALE MARK (SYRINGE) ×3 IMPLANT
TRAY FOLEY W/METER SILVER 14FR (SET/KITS/TRAYS/PACK) ×3 IMPLANT
WRAP KNEE MAXI GEL POST OP (GAUZE/BANDAGES/DRESSINGS) ×3 IMPLANT
YANKAUER SUCT BULB TIP 10FT TU (MISCELLANEOUS) ×6 IMPLANT

## 2015-07-18 NOTE — Anesthesia Preprocedure Evaluation (Addendum)
Anesthesia Evaluation  Patient identified by MRN, date of birth, ID band Patient awake    Reviewed: Allergy & Precautions, NPO status , Patient's Chart, lab work & pertinent test results  Airway Mallampati: II  TM Distance: >3 FB Neck ROM: Full    Dental  (+) Teeth Intact, Dental Advisory Given   Pulmonary former smoker,    Pulmonary exam normal breath sounds clear to auscultation       Cardiovascular negative cardio ROS Normal cardiovascular exam Rhythm:Regular Rate:Normal     Neuro/Psych PSYCHIATRIC DISORDERS Depression Multiple sclerosis  Neuromuscular disease    GI/Hepatic negative GI ROS, Neg liver ROS,   Endo/Other  negative endocrine ROS  Renal/GU negative Renal ROS     Musculoskeletal  (+) Arthritis , Osteoarthritis,    Abdominal   Peds  Hematology negative hematology ROS (+)   Anesthesia Other Findings Day of surgery medications reviewed with the patient.  Reproductive/Obstetrics                          Anesthesia Physical Anesthesia Plan  ASA: III  Anesthesia Plan: General and Regional   Post-op Pain Management: GA combined w/ Regional for post-op pain   Induction: Intravenous  Airway Management Planned: LMA  Additional Equipment:   Intra-op Plan:   Post-operative Plan: Extubation in OR  Informed Consent: I have reviewed the patients History and Physical, chart, labs and discussed the procedure including the risks, benefits and alternatives for the proposed anesthesia with the patient or authorized representative who has indicated his/her understanding and acceptance.   Dental advisory given  Plan Discussed with: CRNA  Anesthesia Plan Comments: (Risks/benefits of general anesthesia discussed with patient including risk of damage to teeth, lips, gum, and tongue, nausea/vomiting, allergic reactions to medications, and the possibility of heart attack, stroke and death.   All patient questions answered.  Patient wishes to proceed.  Adductor canal nerve block plus GA)        Anesthesia Quick Evaluation

## 2015-07-18 NOTE — Interval H&P Note (Signed)
History and Physical Interval Note:  07/18/2015 11:51 AM  Barbara Singleton  has presented today for surgery, with the diagnosis of RIGHTKNEE OA  The various methods of treatment have been discussed with the patient and family. After consideration of risks, benefits and other options for treatment, the patient has consented to  Procedure(s): TOTAL KNEE ARTHROPLASTY (Right) as a surgical intervention .  The patient's history has been reviewed, patient examined, no change in status, stable for surgery.  I have reviewed the patient's chart and labs.  Questions were answered to the patient's satisfaction.     Shelda Pal

## 2015-07-18 NOTE — Anesthesia Procedure Notes (Addendum)
Anesthesia Regional Block:  Adductor canal block  Pre-Anesthetic Checklist: ,, timeout performed, Correct Patient, Correct Site, Correct Laterality, Correct Procedure, Correct Position, site marked, Risks and benefits discussed,  Surgical consent,  Pre-op evaluation,  At surgeon's request and post-op pain management  Laterality: Right  Prep: chloraprep       Needles:  Injection technique: Single-shot  Needle Type: Echogenic Needle     Needle Length: 10cm 10 cm Needle Gauge: 21 and 21 G    Additional Needles:  Procedures: ultrasound guided (picture in chart) Adductor canal block Narrative:  Injection made incrementally with aspirations every 5 mL.  Performed by: Personally  Anesthesiologist: Cecile Hearing  Additional Notes: No pain on injection. No increased resistance to injection. Injection made in 5cc increments.  Good needle visualization.  Patient tolerated procedure well.   Procedure Name: LMA Insertion Date/Time: 07/18/2015 12:34 PM Performed by: Donna Bernard Pre-anesthesia Checklist: Patient identified Patient Re-evaluated:Patient Re-evaluated prior to inductionOxygen Delivery Method: Circle system utilized Preoxygenation: Pre-oxygenation with 100% oxygen Intubation Type: IV induction Ventilation: Mask ventilation without difficulty LMA: LMA inserted LMA Size: 4.0 Number of attempts: 1 Dental Injury: Teeth and Oropharynx as per pre-operative assessment

## 2015-07-18 NOTE — Op Note (Signed)
NAME:  Barbara Singleton                      MEDICAL RECORD NO.:  [id number]                             FACILITY:  [facility]      PHYSICIAN:  Madlyn Frankel. Charlann Boxer, M.D.  DATE OF BIRTH:  1951-04-07      DATE OF PROCEDURE:  07/18/2015                                     OPERATIVE REPORT         PREOPERATIVE DIAGNOSIS:  Right knee osteoarthritis.      POSTOPERATIVE DIAGNOSIS:  Right knee osteoarthritis.      FINDINGS:  The patient was noted to have complete loss of cartilage and   bone-on-bone arthritis with associated osteophytes in the lateral and patellofemoral compartments of   the knee with a significant synovitis and associated effusion.      PROCEDURE:  Right total knee replacement.      COMPONENTS USED:  DePuy Attune rotating platform posterior stabilized knee   system, a size 6N femur, 5 tibia, size 7 mm PS AOX insert, and 35 anatomic patellar   button.      SURGEON:  Madlyn Frankel. Charlann Boxer, M.D.      ASSISTANT:  Skip Mayer, PA-C.      ANESTHESIA:  General and Regional.      SPECIMENS:  None.      COMPLICATION:  None.      DRAINS:  None  EBL: <50cc      TOURNIQUET TIME:   Total Tourniquet Time Documented: Thigh (Right) - 37 minutes Total: Thigh (Right) - 37 minutes  .      The patient was stable to the recovery room.      INDICATION FOR PROCEDURE:  Barbara Singleton is a 65 y.o. female patient of   mine.  The patient had been seen, evaluated, and treated conservatively in the   office with medication, activity modification, and injections.  The patient had   radiographic changes of bone-on-bone arthritis with endplate sclerosis and osteophytes noted.      The patient failed conservative measures including medication, injections, and activity modification, and at this point was ready for more definitive measures.   Based on the radiographic changes and failed conservative measures, the patient   decided to proceed with total knee replacement.  Risks of infection,   DVT,  component failure, need for revision surgery, postop course, and   expectations were all   discussed and reviewed.  Consent was obtained for benefit of pain   relief.      PROCEDURE IN DETAIL:  The patient was brought to the operative theater.   Once adequate anesthesia, preoperative antibiotics, 2 gm of Ancef, 1 gm of Tranexamic Acid, and 10 mg of Decadron administered, the patient was positioned supine with the right thigh tourniquet placed.  The  right lower extremity was prepped and draped in sterile fashion.  A time-   out was performed identifying the patient, planned procedure, and   extremity.      The right lower extremity was placed in the [facility] leg holder.  The leg was   exsanguinated, tourniquet elevated to 300 mmHg.  A midline incision was  made followed by median parapatellar arthrotomy.  Following initial   exposure, attention was first directed to the patella.  Precut   measurement was noted to be 23 mm.  I resected down to 14 mm and used a   35 patellar button to restore patellar height as well as cover the cut   surface.      The lug holes were drilled and a metal shim was placed to protect the   patella from retractors and saw blades.      At this point, attention was now directed to the femur.  The femoral   canal was opened with a drill, irrigated to try to prevent fat emboli.  An   intramedullary rod was passed at 5 degrees valgus, 9 mm of bone was   resected off the distal femur.  Following this resection, the tibia was   subluxated anteriorly.  Using the extramedullary guide, 4 mm of bone was resected off   the proximal lateral tibia.  We confirmed the gap would be   stable medially and laterally with a size 6 insert as well as confirmed   the cut was perpendicular in the coronal plane, checking with an alignment rod.      Once this was done, I sized the femur to be a size 6 in the anterior-   posterior dimension, chose a narrow component based on medial and    lateral dimension.  The size 6 rotation block was then pinned in   position anterior referenced using the C-clamp to set rotation.  The   anterior, posterior, and  chamfer cuts were made without difficulty nor   notching making certain that I was along the anterior cortex to help   with flexion gap stability.      The final box cut was made off the lateral aspect of distal femur.      At this point, the tibia was sized to be a size 5, the size 5 tray was   then pinned in position through the medial third of the tubercle,   drilled, and keel punched.  Trial reduction was now carried with a 6 femur,  5 tibia, a size 6 then 7 mm PS insert, and the 35 anatomic patella botton.  The knee was brought to   extension, full extension with good flexion stability with the patella   tracking through the trochlea without application of pressure.  Given   all these findings, the trial components removed.  Final components were   opened and cement was mixed.  The knee was irrigated with normal saline   solution and pulse lavage.  The synovial lining was   then injected with 0.25% Marcaine with epinephrine and 1 cc of Toradol,   total of 61 cc.      The knee was irrigated.  Final implants were then cemented onto clean and   dried cut surfaces of bone with the knee brought to extension with a size 7 mm trial insert.      Once the cement had fully cured, the excess cement was removed   throughout the knee.  I confirmed I was satisfied with the range of   motion and stability, and the final size 7 mm PS AOX insert was chosen.  It was   placed into the knee.      The tourniquet had been let down at 37 minutes.  No significant   hemostasis required.  The   extensor mechanism was then  reapproximated using #1 Vicryl and #0 V-lock sutures with the knee   in flexion.  The   remaining wound was closed with 2-0 Vicryl and running 4-0 Monocryl.   The knee was cleaned, dried, dressed sterilely using Dermabond  and   Aquacel dressing.  The patient was then   brought to recovery room in stable condition, tolerating the procedure   well.   Please note that Physician Assistant, Skip Mayer, PA-C, was present for the entirety of the case, and was utilized for pre-operative positioning, peri-operative retractor management, general facilitation of the procedure.  He was also utilized for primary wound closure at the end of the case.              Madlyn Frankel Charlann Boxer, M.D.    07/18/2015 3:44 PM

## 2015-07-18 NOTE — Transfer of Care (Signed)
Immediate Anesthesia Transfer of Care Note  Patient: Barbara Singleton  Procedure(s) Performed: Procedure(s): TOTAL KNEE ARTHROPLASTY (Right)  Patient Location: PACU  Anesthesia Type:General  Level of Consciousness: alert   Airway & Oxygen Therapy: Patient connected to face mask oxygen  Post-op Assessment: Post -op Vital signs reviewed and stable  Post vital signs: stable  Last Vitals:  Filed Vitals:   07/18/15 1013  BP: 128/63  Pulse: 61  Temp: 36.7 C  Resp: 16    Complications: No apparent anesthesia complications

## 2015-07-19 ENCOUNTER — Encounter (HOSPITAL_COMMUNITY): Payer: Self-pay | Admitting: Orthopedic Surgery

## 2015-07-19 DIAGNOSIS — E663 Overweight: Secondary | ICD-10-CM | POA: Diagnosis present

## 2015-07-19 LAB — BASIC METABOLIC PANEL
Anion gap: 9 (ref 5–15)
BUN: 15 mg/dL (ref 6–20)
CALCIUM: 9 mg/dL (ref 8.9–10.3)
CO2: 26 mmol/L (ref 22–32)
CREATININE: 0.62 mg/dL (ref 0.44–1.00)
Chloride: 102 mmol/L (ref 101–111)
GFR calc Af Amer: >60 mL/min (ref >60)
GFR calc non Af Amer: >60 mL/min (ref >60)
GLUCOSE: 122 mg/dL — AB (ref 65–99)
Potassium: 4.1 mmol/L (ref 3.5–5.1)
Sodium: 137 mmol/L (ref 135–145)

## 2015-07-19 LAB — CBC
HEMATOCRIT: 35.7 % — AB (ref 36.0–46.0)
Hemoglobin: 11.6 g/dL — ABNORMAL LOW (ref 12.0–15.0)
MCH: 29.4 pg (ref 26.0–34.0)
MCHC: 32.5 g/dL (ref 30.0–36.0)
MCV: 90.4 fL (ref 78.0–100.0)
Platelets: 215 10*3/uL (ref 150–400)
RBC: 3.95 MIL/uL (ref 3.87–5.11)
RDW: 13.9 % (ref 11.5–15.5)
WBC: 10.5 10*3/uL (ref 4.0–10.5)

## 2015-07-19 MED ORDER — METHOCARBAMOL 500 MG PO TABS: 500.0000 mg | ORAL_TABLET | Freq: Four times a day (QID) | ORAL | Status: DC | PRN

## 2015-07-19 MED ORDER — CELECOXIB 200 MG PO CAPS: 200.0000 mg | ORAL_CAPSULE | Freq: Two times a day (BID) | ORAL | Status: DC

## 2015-07-19 MED ORDER — OXYCODONE HCL 5 MG PO TABS: 5.0000 mg | ORAL_TABLET | ORAL | Status: DC | PRN

## 2015-07-19 MED ORDER — ASPIRIN 325 MG PO TBEC: 325.0000 mg | DELAYED_RELEASE_TABLET | Freq: Two times a day (BID) | ORAL | Status: AC

## 2015-07-19 MED ORDER — POLYETHYLENE GLYCOL 3350 17 G PO PACK: 17.0000 g | PACK | Freq: Two times a day (BID) | ORAL | Status: DC

## 2015-07-19 MED ORDER — ACETAMINOPHEN 500 MG PO TABS: 1000.0000 mg | ORAL_TABLET | Freq: Three times a day (TID) | ORAL | Status: DC | PRN

## 2015-07-19 MED ORDER — DOCUSATE SODIUM 100 MG PO CAPS: 100.0000 mg | ORAL_CAPSULE | Freq: Two times a day (BID) | ORAL | Status: DC

## 2015-07-19 MED ORDER — FERROUS SULFATE 325 (65 FE) MG PO TABS: 325.0000 mg | ORAL_TABLET | Freq: Three times a day (TID) | ORAL | Status: DC

## 2015-07-19 MED ORDER — HYDROCODONE-ACETAMINOPHEN 7.5-325 MG PO TABS: 1.0000 | ORAL_TABLET | ORAL | Status: DC | PRN

## 2015-07-19 MED ORDER — OXYCODONE HCL 5 MG PO TABS
5.0000 mg | ORAL_TABLET | ORAL | Status: DC | PRN
  Administered 2015-07-19 (×2): 10 mg via ORAL
  Filled 2015-07-19 (×2): qty 2

## 2015-07-19 NOTE — Care Management Note (Signed)
Case Management Note  Patient Details  Name: JADEYN HARGETT MRN: [id number] Date of Birth: June 19, 1950  Subjective/Objective:                  RTKA Action/Plan: dsicharge planning Expected Discharge Date:  07/19/15              Expected Discharge Plan:  [place]  In-House Referral:     Discharge planning Services  CM Consult  Post Acute Care Choice:  Home Health Choice offered to:  Patient  DME Arranged:  3-N-1, Walker rolling DME Agency:  [place]:  PT [facility] Agency:     Status of Service:  Completed, signed off  Medicare Important Message Given:    Date Medicare IM Given:    Medicare IM give by:    Date Additional Medicare IM Given:    Additional Medicare Important Message give by:     If discussed at [place] of Stay Meetings, dates discussed:    Additional Comments: Utilization Review complete.  CM met with pt in room to offer choice of home health agency.  Pt chooses [facility] to render HHPT.  Referral given to [employer], Tim.  CM called [facility] DME rep, Lecretia to please deliver the 3n1 and rolling walker to room prior to discharge.  No other CM needs were communicated.  Dellie Catholic, RN 07/19/2015, 10:16 AM

## 2015-07-19 NOTE — Anesthesia Postprocedure Evaluation (Signed)
Anesthesia Post Note  Patient: Barbara Singleton  Procedure(s) Performed: Procedure(s) (LRB): TOTAL KNEE ARTHROPLASTY (Right)  Patient location during evaluation: PACU Anesthesia Type: General and Regional Level of consciousness: awake and alert Pain management: pain level controlled Vital Signs Assessment: post-procedure vital signs reviewed and stable Respiratory status: spontaneous breathing, nonlabored ventilation, respiratory function stable and patient connected to nasal cannula oxygen Cardiovascular status: blood pressure returned to baseline and stable Postop Assessment: no signs of nausea or vomiting Anesthetic complications: no    Last Vitals:  Filed Vitals:   07/19/15 0131 07/19/15 0546  BP: 104/57 104/58  Pulse: 68 69  Temp: 36.3 C 36.5 C  Resp: 15 15    Last Pain:  Filed Vitals:   07/19/15 0547  PainSc: 5                  Cecile Hearing

## 2015-07-19 NOTE — Addendum Note (Signed)
Addendum  created 07/19/15 1149 by Elyn Peers, CRNA   Modules edited: Charges VN

## 2015-07-19 NOTE — Discharge Instructions (Signed)

## 2015-07-19 NOTE — Evaluation (Signed)
Physical Therapy Evaluation Patient Details Name: Barbara Singleton MRN: [id number] DOB: 1950-05-15 Today's Date: 07/19/2015   History of Present Illness  R TKA; PMHx: MS  Clinical Impression  Pt is s/p TKA resulting in the deficits listed below (see PT Problem List). * Pt will benefit from skilled PT to increase their independence and safety with mobility to allow discharge to the venue listed below.      Follow Up Recommendations Home health PT    Equipment Recommendations  Rolling walker with 5" wheels    Recommendations for Other Services       Precautions / Restrictions Precautions Precautions: Knee Restrictions Weight Bearing Restrictions: No Other Position/Activity Restrictions: WBAT      Mobility  Bed Mobility Overal bed mobility: Needs Assistance Bed Mobility: Supine to Sit     Supine to sit: Min assist;Min guard     General bed mobility comments: cues for technique (Simultaneous filing. User may not have seen previous data.)  Transfers Overall transfer level: Needs assistance Equipment used: Rolling walker (2 wheeled) Transfers: Sit to/from Stand Sit to Stand: Min assist         General transfer comment: cues for hand placement and RLE position  Ambulation/Gait Ambulation/Gait assistance: Min assist Ambulation Distance (Feet): 60 Feet Assistive device: Rolling walker (2 wheeled) Gait Pattern/deviations: Step-to pattern;Antalgic;Trunk flexed     General Gait Details: cues for sequence and RW position  Stairs            Wheelchair Mobility    Modified Rankin (Stroke Patients Only)       Balance                                             Pertinent Vitals/Pain Pain Assessment: 0-10 Pain Score: 3  Pain Location: R knee Pain Descriptors / Indicators: Sore Pain Intervention(s): Limited activity within patient's tolerance;Monitored during session    Home Living Family/patient expects to be discharged to:: Private  residence Living Arrangements: Spouse/significant other Available Help at Discharge: Family Type of Home: House Home Access: Stairs to enter Entrance Stairs-Rails: Can reach both Entrance Stairs-Number of Steps: 4 Home Layout: One level Home Equipment: Cane - single point;Walker - standard      Prior Function Level of Independence: Independent with assistive device(s);Independent         Comments: amb with cane     Hand Dominance   Dominant Hand: Right    Extremity/Trunk Assessment   Upper Extremity Assessment: Overall WFL for tasks assessed           Lower Extremity Assessment: Defer to PT evaluation RLE Deficits / Details: ankle WFL, knee extension and hip flexion 3/5       Communication   Communication: No difficulties  Cognition Arousal/Alertness: Awake/alert Behavior During Therapy: WFL for tasks assessed/performed Overall Cognitive Status: Within Functional Limits for tasks assessed                      General Comments      Exercises Total Joint Exercises Ankle Circles/Pumps: AROM;Both;10 reps Quad Sets: 10 reps;AROM;Both Heel Slides: AAROM;Right;10 reps      Assessment/Plan    PT Assessment Patient needs continued PT services  PT Diagnosis Difficulty walking   PT Problem List Decreased strength;Decreased activity tolerance;Decreased mobility;Decreased knowledge of use of DME;Decreased range of motion;Decreased knowledge of precautions  PT Treatment Interventions  DME instruction;Gait training;Therapeutic activities;Functional mobility training;Therapeutic exercise;Stair training;Patient/family education   PT Goals (Current goals can be found in the Care Plan section) Acute Rehab PT Goals Patient Stated Goal: home, less knee pain PT Goal Formulation: With patient Time For Goal Achievement: 07/26/15 Potential to Achieve Goals: Good    Frequency 7X/week   Barriers to discharge        Co-evaluation               End of  Session Equipment Utilized During Treatment: Gait belt Activity Tolerance: Patient tolerated treatment well Patient left: in chair;with chair alarm set;with call bell/phone within reach           Time: 1700-1749 PT Time Calculation (min) (ACUTE ONLY): 17 min   Charges:   PT Evaluation $PT Eval Low Complexity: 1 Procedure   PT G Codes:        Deiondre Harrower 07-28-2015, 11:13 AM

## 2015-07-19 NOTE — Progress Notes (Signed)
     Subjective: 1 Day Post-Op Procedure(s) (LRB): TOTAL KNEE ARTHROPLASTY (Right)   Seen by Dr. Charlann Boxer. Patient reports pain as mild, pain controlled. No events throughout the night. Ready to be discharged home if she does well with PT.  Objective:   VITALS:   Filed Vitals:   07/19/15 0131 07/19/15 0546  BP: 104/57 104/58  Pulse: 68 69  Temp: 97.4 F (36.3 C) 97.7 F (36.5 C)  Resp: 15 15    Dorsiflexion/Plantar flexion intact Incision: dressing C/D/I No cellulitis present Compartment soft  LABS  Recent Labs  07/19/15 0351  HGB 11.6*  HCT 35.7*  WBC 10.5  PLT 215     Recent Labs  07/19/15 0351  NA 137  K 4.1  BUN 15  CREATININE 0.62  GLUCOSE 122*     Assessment/Plan: 1 Day Post-Op Procedure(s) (LRB): TOTAL KNEE ARTHROPLASTY (Right) Foley cath d/c'ed Advance diet Up with therapy D/C IV fluids Discharge home with home health  Follow up in 2 weeks at [facility]. Follow up with OLIN,[name] in 2 weeks.  Contact information:  [facility] [address], [address] [place] [address] [phone]    Overweight (BMI 25-29.9) Estimated body mass index is 28.34 kg/(m^2) as calculated from the following:   Height as of this encounter: 5\' 2"  (1.575 m).   Weight as of this encounter: 70.308 kg (155 lb). Patient also counseled that weight may inhibit the healing process Patient counseled that losing weight will help with future health issues        Barbara Singleton. Barbara Singleton   PAC  07/19/2015, 8:06 AM

## 2015-07-19 NOTE — Progress Notes (Signed)
   07/19/15 1400  PT Visit Information  Last PT Received On 07/19/15  Assistance Needed +1  History of Present Illness R TKA; PMHx: MS  PT Time Calculation  PT Start Time (ACUTE ONLY) 1349  PT Stop Time (ACUTE ONLY) 1414  PT Time Calculation (min) (ACUTE ONLY) 25 min  Subjective Data  Patient Stated Goal home, less knee pain  Precautions  Precautions Knee  Restrictions  Other Position/Activity Restrictions WBAT  Pain Assessment  Pain Assessment 0-10  Pain Score 3  Pain Location R knee  Pain Descriptors / Indicators Sore  Pain Intervention(s) Limited activity within patient's tolerance;Monitored during session;Premedicated before session  Cognition  Arousal/Alertness Awake/alert  Behavior During Therapy WFL for tasks assessed/performed  Overall Cognitive Status Within Functional Limits for tasks assessed  Bed Mobility  General bed mobility comments (pt OOB)  Transfers  Overall transfer level Needs assistance  Equipment used Rolling walker (2 wheeled)  Transfers Sit to/from Stand  Sit to Stand Min guard;Supervision  General transfer comment cues for hand placement and RLE position  Ambulation/Gait  Ambulation/Gait assistance Min guard  Ambulation Distance (Feet) 90 Feet  Assistive device Rolling walker (2 wheeled)  Gait Pattern/deviations Step-to pattern;Step-through pattern  General Gait Details cues for sequence and RW position  Stairs Yes  Stairs assistance Min guard  Stair Management Two rails;Step to pattern;Forwards  Number of Stairs 3  General stair comments cues for sequence  Total Joint Exercises  Ankle Circles/Pumps AROM;Both;10 reps  Quad Sets 10 reps;AROM;Both  Heel Slides AAROM;Right;10 reps  Short Arc Quad AROM;Strengthening;Right;10 reps  Hip ABduction/ADduction AAROM;AROM;Strengthening;Right;10 reps  Straight Leg Raises AROM;Strengthening;Right;10 reps  PT - End of Session  Equipment Utilized During Treatment Gait belt  Activity Tolerance Patient  tolerated treatment well  Patient left in chair;with call bell/phone within reach;with chair alarm set;with family/visitor present  PT - Assessment/Plan  PT Plan Current plan remains appropriate  PT Frequency (ACUTE ONLY) 7X/week  Follow Up Recommendations Home health PT  PT equipment Rolling walker with 5" wheels  PT Goal Progression  Progress towards PT goals Progressing toward goals  Acute Rehab PT Goals  PT Goal Formulation With patient  Time For Goal Achievement 07/26/15  Potential to Achieve Goals Good  PT General Charges  $$ ACUTE PT VISIT 1 Procedure  PT Treatments  $Gait Training 8-22 mins  $Therapeutic Exercise 8-22 mins

## 2015-07-19 NOTE — Progress Notes (Signed)
Occupational Therapy Evaluation Patient Details Name: Barbara Singleton MRN: [id number] DOB: 23-Apr-1951 Today's Date: 07/19/2015    History of Present Illness R TKA; PMHx: MS   Clinical Impression   Patient is s/p R TKA resulting in deficits listed below. All OT education completed and patient will have husband's assistance at discharge. No further OT needs; will sign off.    Follow Up Recommendations  No OT follow up;Supervision/Assistance - 24 hour    Equipment Recommendations  3 in 1 bedside comode    Recommendations for Other Services       Precautions / Restrictions Precautions Precautions: Knee Restrictions Weight Bearing Restrictions: No Other Position/Activity Restrictions: WBAT      Mobility Bed Mobility            General bed mobility comments: NT -- OOB in recliner  Transfers Overall transfer level: Needs assistance Equipment used: Rolling walker (2 wheeled) Transfers: Sit to/from Stand Sit to Stand:Supervision              Balance                                            ADL Overall ADL's : Needs assistance/impaired Eating/Feeding: Independent;Sitting   Grooming: Wash/dry hands;Supervision/safety   Upper Body Bathing: Set up;Sitting   Lower Body Bathing: Minimal assistance;Sit to/from stand   Upper Body Dressing : Set up;Sitting   Lower Body Dressing: Minimal assistance;Sit to/from stand   Toilet Transfer: Supervision/safety;Ambulation;Regular Toilet;BSC;RW   Toileting- Clothing Manipulation and Hygiene: Supervision/safety;Sit to/from stand       Functional mobility during ADLs: Supervision/safety;Rolling walker General ADL Comments: Patient able to reach feet from seated position. Educated on LB dressing techniques. Patient reports husband can assist if needed. Reviewed tub transfer technique verbally; pt declined practicing. Reports having husband assist even PTA due to R leg being weak. Educated pt to have  husband assist at home.      Vision     Perception     Praxis      Pertinent Vitals/Pain Pain Assessment: 0-10 Pain Score: 3  Pain Location: R knee Pain Descriptors / Indicators: Sore Pain Intervention(s): Limited activity within patient's tolerance;Monitored during session     Hand Dominance Right   Extremity/Trunk Assessment Upper Extremity Assessment Upper Extremity Assessment: Overall WFL for tasks assessed   Lower Extremity Assessment Lower Extremity Assessment: Defer to PT evaluation       Communication Communication Communication: No difficulties   Cognition Arousal/Alertness: Awake/alert Behavior During Therapy: WFL for tasks assessed/performed Overall Cognitive Status: Within Functional Limits for tasks assessed                     General Comments       Exercises       Shoulder Instructions      Home Living Family/patient expects to be discharged to:: Private residence Living Arrangements: Spouse/significant other Available Help at Discharge: Family Type of Home: House Home Access: Stairs to enter Secretary/administrator of Steps: 4 Entrance Stairs-Rails: Can reach both Home Layout: One level     Bathroom Shower/Tub: Tub/shower unit;Curtain Shower/tub characteristics: Engineer, building services: Standard Bathroom Accessibility: Yes How Accessible: Accessible via walker Home Equipment: Cane - single point;Walker - standard;Grab bars - tub/shower          Prior Functioning/Environment Level of Independence: Independent with assistive device(s);Independent  Comments: amb with cane    OT Diagnosis: Acute pain   OT Problem List: Decreased strength;Decreased range of motion;Decreased knowledge of use of DME or AE;Pain   OT Treatment/Interventions:      OT Goals(Current goals can be found in the care plan section) Acute Rehab OT Goals Patient Stated Goal: home, less knee pain OT Goal Formulation: All assessment and  education complete, DC therapy  OT Frequency:     Barriers to D/C:            Co-evaluation              End of Session Equipment Utilized During Treatment: Rolling walker  Activity Tolerance: Patient tolerated treatment well Patient left: in chair;with call bell/phone within reach;with chair alarm set   Time: 1100-1114 OT Time Calculation (min): 14 min Charges:  OT General Charges $OT Visit: 1 Procedure OT Evaluation $OT Eval Moderate Complexity: 1 Procedure G-Codes:    Karas Pickerill A Aug 04, 2015, 11:26 AM

## 2015-07-19 NOTE — Progress Notes (Signed)
[facility]  Delivered rw and commode to room  Barbara Singleton 07/19/2015, 10:41 AM

## 2015-07-24 NOTE — Discharge Summary (Signed)
Physician Discharge Summary  Patient ID: Barbara Singleton MRN: [id number] DOB/AGE: [birth year] 65 y.o.  Admit date: 07/18/2015 Discharge date: 07/19/2015   Procedures:  Procedure(s) (LRB): TOTAL KNEE ARTHROPLASTY (Right)  Attending Physician:  Dr. Durene Romans   Admission Diagnoses:   Right knee primary OA / pain  Discharge Diagnoses:  Principal Problem:   S/P right TKA Active Problems:   Overweight (BMI 25.0-29.9)  Past Medical History  Diagnosis Date  . Multiple sclerosis (HCC)   . Depression   . Arthritis     Osteoarthritis- knees.  . Neuromuscular disorder [facility])     Dr. Algis Downs Jeffrey,neurology- [place], neurology foolows last visit 3 montha ago.    HPI:    Barbara Singleton, 65 y.o. female, has a history of pain and functional disability in the right knee due to arthritis and has failed non-surgical conservative treatments for greater than 12 weeks to includeNSAID's and/or analgesics, corticosteriod injections, use of assistive devices and activity modification. Onset of symptoms was gradual, starting 2+ years ago with gradually worsening course since that time. The patient noted no past surgery on the right knee(s). Patient currently rates pain in the right knee(s) at 9 out of 10 with activity. Patient has night pain, worsening of pain with activity and weight bearing, pain that interferes with activities of daily living, pain with passive range of motion, crepitus and joint swelling. Patient has evidence of periarticular osteophytes and joint space narrowing by imaging studies. There is no active infection. Risks, benefits and expectations were discussed with the patient. Risks including but not limited to the risk of anesthesia, blood clots, nerve damage, blood vessel damage, failure of the prosthesis, infection and up to and including death. Patient understand the risks, benefits and expectations and wishes to proceed with surgery.  PCP: Thora Lance, MD    Discharged Condition: good  Hospital Course:  Patient underwent the above stated procedure on 07/18/2015. Patient tolerated the procedure well and brought to the recovery room in good condition and subsequently to the floor.  POD #1 BP: 104/58 ; Pulse: 69 ; Temp: 97.7 F (36.5 C) ; Resp: 15 Patient reports pain as mild, pain controlled. No events throughout the night. Ready to be discharged home. Dorsiflexion/plantar flexion intact, incision: dressing C/D/I, no cellulitis present and compartment soft.   LABS  Basename    HGB     11.6  HCT     35.7    Discharge Exam: General appearance: alert, cooperative and no distress Extremities: Homans sign is negative, no sign of DVT, no edema, redness or tenderness in the calves or thighs and no ulcers, gangrene or trophic changes  Disposition: Home with follow up in 2 weeks   Follow-up Information    Follow up with Shelda Pal, MD. Schedule an appointment as soon as possible for a visit in 2 weeks.   Specialty:  Orthopedic Surgery   Contact information:   [address] [place] [address] [phone]       Follow up with [facility].   Why:  rolling walker and 3n1   Contact information:   [address] [place] [address] [phone]       Follow up with [facility].   Why:  home health physical therapy   Contact information:   [address] [employer] [place] [employer] [place] [address] [phone]       Discharge Instructions    Call MD / Call 911  Complete by:  As directed   If you experience chest pain or shortness of breath, CALL 911 and be transported to the hospital emergency room.  If you develope a fever above 101 F, pus (white drainage) or increased drainage or redness at the wound, or calf pain, call your surgeon's office.     Change dressing    Complete by:  As directed   Maintain surgical dressing until follow up in the clinic. If the edges start to pull up,  may reinforce with tape. If the dressing is no longer working, may remove and cover with gauze and tape, but must keep the area dry and clean.  Call with any questions or concerns.     Constipation Prevention    Complete by:  As directed   Drink plenty of fluids.  Prune juice may be helpful.  You may use a stool softener, such as Colace (over the counter) 100 mg twice a day.  Use MiraLax (over the counter) for constipation as needed.     Diet - low sodium heart healthy    Complete by:  As directed      Discharge instructions    Complete by:  As directed   Maintain surgical dressing until follow up in the clinic. If the edges start to pull up, may reinforce with tape. If the dressing is no longer working, may remove and cover with gauze and tape, but must keep the area dry and clean.  Follow up in 2 weeks at [facility]. Call with any questions or concerns.     Increase activity slowly as tolerated    Complete by:  As directed   Weight bearing as tolerated with assist device (walker, cane, etc) as directed, use it as long as suggested by your surgeon or therapist, typically at least 4-6 weeks.     TED hose    Complete by:  As directed   Use stockings (TED hose) for 2 weeks on both leg(s).  You may remove them at night for sleeping.             Medication List    STOP taking these medications        HYDROcodone-acetaminophen 5-325 MG tablet  Commonly known as:  NORCO/VICODIN      TAKE these medications        acetaminophen 500 MG tablet  Commonly known as:  TYLENOL  Take 2 tablets (1,000 mg total) by mouth every 8 (eight) hours as needed.     alendronate 70 MG tablet  Commonly known as:  FOSAMAX  Take 70 mg by mouth once a week. Take with a full glass of water on an empty stomach.     aspirin 325 MG EC tablet  Take 1 tablet (325 mg total) by mouth 2 (two) times daily.     CALCIUM + D PO  Take 1 tablet by mouth daily.     celecoxib 200 MG capsule  Commonly known  as:  CELEBREX  Take 1 capsule (200 mg total) by mouth every 12 (twelve) hours.     COPAXONE 20 MG/ML Sosy injection  Generic drug:  glatiramer  Inject 20 mg into the skin daily.     docusate sodium 100 MG capsule  Commonly known as:  COLACE  Take 1 capsule (100 mg total) by mouth 2 (two) times daily.     ferrous sulfate 325 (65 FE) MG tablet  Take 1 tablet (325 mg total) by mouth 3 (three) times daily  after meals.     fluvoxaMINE 100 MG tablet  Commonly known as:  LUVOX  Take 100 mg by mouth 3 (three) times daily.     lamoTRIgine 100 MG tablet  Commonly known as:  LAMICTAL  Take 100 mg by mouth 2 (two) times daily.     LATUDA 20 MG Tabs tablet  Generic drug:  lurasidone  Take 20 mg by mouth every morning.     methocarbamol 500 MG tablet  Commonly known as:  ROBAXIN  Take 1 tablet (500 mg total) by mouth every 6 (six) hours as needed for muscle spasms.     methylphenidate 10 MG tablet  Commonly known as:  RITALIN  Take 10 mg by mouth 2 (two) times daily.     multivitamin with minerals Tabs tablet  Take 1 tablet by mouth daily.     oxyCODONE 5 MG immediate release tablet  Commonly known as:  Oxy IR/ROXICODONE  Take 1-3 tablets (5-15 mg total) by mouth every 4 (four) hours as needed for moderate pain or severe pain.     polyethylene glycol packet  Commonly known as:  MIRALAX / GLYCOLAX  Take 17 g by mouth 2 (two) times daily.     traZODone 50 MG tablet  Commonly known as:  DESYREL  Take 50 mg by mouth at bedtime.     VITAMIN B 12 PO  Take 1 tablet by mouth daily.     vitamin C 500 MG tablet  Commonly known as:  ASCORBIC ACID  Take 500 mg by mouth daily.         Signed: Anastasio Auerbach. Zakery Normington   PA-C  07/24/2015, 4:38 PM

## 2015-10-26 ENCOUNTER — Other Ambulatory Visit: Payer: Self-pay | Admitting: Family Medicine

## 2015-10-26 DIAGNOSIS — Z1231 Encounter for screening mammogram for malignant neoplasm of breast: Secondary | ICD-10-CM

## 2015-11-13 ENCOUNTER — Ambulatory Visit
Admission: RE | Admit: 2015-11-13 | Discharge: 2015-11-13 | Disposition: A | Discharge status: Home / Self Care | Payer: Medicare Other | Source: Ambulatory Visit | Attending: Family Medicine | Admitting: Family Medicine

## 2015-11-13 DIAGNOSIS — Z1231 Encounter for screening mammogram for malignant neoplasm of breast: Secondary | ICD-10-CM

## 2015-11-16 ENCOUNTER — Other Ambulatory Visit: Payer: Self-pay | Admitting: Family Medicine

## 2015-11-16 DIAGNOSIS — R928 Other abnormal and inconclusive findings on diagnostic imaging of breast: Secondary | ICD-10-CM

## 2015-11-30 ENCOUNTER — Ambulatory Visit
Admission: RE | Admit: 2015-11-30 | Discharge: 2015-11-30 | Disposition: A | Discharge status: Home / Self Care | Payer: Medicare Other | Source: Ambulatory Visit | Attending: Family Medicine | Admitting: Family Medicine

## 2015-11-30 ENCOUNTER — Other Ambulatory Visit: Payer: Self-pay | Admitting: Family Medicine

## 2015-11-30 DIAGNOSIS — R928 Other abnormal and inconclusive findings on diagnostic imaging of breast: Secondary | ICD-10-CM

## 2015-12-04 ENCOUNTER — Ambulatory Visit
Admission: RE | Admit: 2015-12-04 | Discharge: 2015-12-04 | Disposition: A | Discharge status: Home / Self Care | Payer: Medicare Other | Source: Ambulatory Visit | Attending: Family Medicine | Admitting: Family Medicine

## 2015-12-04 DIAGNOSIS — R928 Other abnormal and inconclusive findings on diagnostic imaging of breast: Secondary | ICD-10-CM

## 2015-12-22 ENCOUNTER — Other Ambulatory Visit: Payer: Self-pay

## 2016-08-29 ENCOUNTER — Other Ambulatory Visit: Payer: Self-pay | Admitting: Orthopaedic Surgery

## 2016-08-29 DIAGNOSIS — M4326 Fusion of spine, lumbar region: Secondary | ICD-10-CM

## 2016-09-08 ENCOUNTER — Ambulatory Visit
Admission: RE | Admit: 2016-09-08 | Discharge: 2016-09-08 | Disposition: A | Discharge status: Home / Self Care | Payer: Medicare Other | Source: Ambulatory Visit | Attending: Orthopaedic Surgery | Admitting: Orthopaedic Surgery

## 2016-09-08 DIAGNOSIS — M4326 Fusion of spine, lumbar region: Secondary | ICD-10-CM

## 2017-01-31 ENCOUNTER — Other Ambulatory Visit: Payer: Self-pay | Admitting: Family Medicine

## 2017-01-31 DIAGNOSIS — Z1231 Encounter for screening mammogram for malignant neoplasm of breast: Secondary | ICD-10-CM

## 2017-02-13 ENCOUNTER — Ambulatory Visit
Admission: RE | Admit: 2017-02-13 | Discharge: 2017-02-13 | Disposition: A | Discharge status: Home / Self Care | Payer: Medicare Other | Source: Ambulatory Visit | Attending: Family Medicine | Admitting: Family Medicine

## 2017-02-13 DIAGNOSIS — Z1231 Encounter for screening mammogram for malignant neoplasm of breast: Secondary | ICD-10-CM

## 2017-10-30 ENCOUNTER — Other Ambulatory Visit: Payer: Self-pay

## 2018-02-05 ENCOUNTER — Other Ambulatory Visit: Payer: Self-pay | Admitting: Family Medicine

## 2018-02-05 DIAGNOSIS — Z1231 Encounter for screening mammogram for malignant neoplasm of breast: Secondary | ICD-10-CM

## 2018-03-19 ENCOUNTER — Ambulatory Visit
Admission: RE | Admit: 2018-03-19 | Discharge: 2018-03-19 | Disposition: A | Discharge status: Home / Self Care | Payer: Medicare Other | Source: Ambulatory Visit | Attending: Family Medicine | Admitting: Family Medicine

## 2018-03-19 DIAGNOSIS — Z1231 Encounter for screening mammogram for malignant neoplasm of breast: Secondary | ICD-10-CM

## 2018-05-25 ENCOUNTER — Other Ambulatory Visit: Payer: Self-pay

## 2019-04-26 ENCOUNTER — Other Ambulatory Visit: Payer: Self-pay | Admitting: Family Medicine

## 2019-05-20 ENCOUNTER — Other Ambulatory Visit: Payer: Self-pay | Admitting: Family Medicine

## 2019-05-20 DIAGNOSIS — M8580 Other specified disorders of bone density and structure, unspecified site: Secondary | ICD-10-CM

## 2019-08-31 ENCOUNTER — Ambulatory Visit
Admission: RE | Admit: 2019-08-31 | Discharge: 2019-08-31 | Disposition: A | Discharge status: Home / Self Care | Payer: Medicare PPO | Source: Ambulatory Visit | Attending: Family Medicine | Admitting: Family Medicine

## 2019-08-31 ENCOUNTER — Other Ambulatory Visit: Payer: Self-pay | Admitting: Family Medicine

## 2019-08-31 ENCOUNTER — Other Ambulatory Visit: Payer: Self-pay

## 2019-08-31 DIAGNOSIS — Z1231 Encounter for screening mammogram for malignant neoplasm of breast: Secondary | ICD-10-CM

## 2019-08-31 DIAGNOSIS — M8580 Other specified disorders of bone density and structure, unspecified site: Secondary | ICD-10-CM

## 2019-12-13 ENCOUNTER — Ambulatory Visit (HOSPITAL_COMMUNITY)
Admission: RE | Admit: 2019-12-13 | Discharge: 2019-12-13 | Disposition: A | Discharge status: Home / Self Care | Payer: Medicare PPO | Source: Ambulatory Visit | Attending: Internal Medicine | Admitting: Internal Medicine

## 2019-12-13 ENCOUNTER — Other Ambulatory Visit: Payer: Self-pay

## 2019-12-13 DIAGNOSIS — G35 Multiple sclerosis: Secondary | ICD-10-CM | POA: Diagnosis present

## 2019-12-13 MED ORDER — SODIUM CHLORIDE 0.9 % IV SOLN
300.0000 mg | Freq: Once | INTRAVENOUS | Status: AC
  Administered 2019-12-13: 300 mg via INTRAVENOUS
  Filled 2019-12-13: qty 10

## 2019-12-13 MED ORDER — DIPHENHYDRAMINE HCL 50 MG/ML IJ SOLN
25.0000 mg | Freq: Once | INTRAMUSCULAR | Status: AC
Start: 2019-12-13 — End: 2019-12-13
  Administered 2019-12-13: 25 mg via INTRAVENOUS
  Filled 2019-12-13: qty 1

## 2019-12-13 MED ORDER — METHYLPREDNISOLONE SODIUM SUCC 125 MG IJ SOLR
100.0000 mg | Freq: Once | INTRAMUSCULAR | Status: AC
  Administered 2019-12-13: 100 mg via INTRAVENOUS
  Filled 2019-12-13: qty 2

## 2019-12-13 MED ORDER — ACETAMINOPHEN 325 MG PO TABS
650.0000 mg | ORAL_TABLET | Freq: Once | ORAL | Status: AC
  Administered 2019-12-13: 650 mg via ORAL
  Filled 2019-12-13: qty 2

## 2019-12-13 MED ORDER — DIPHENHYDRAMINE HCL 50 MG/ML IJ SOLN
25.0000 mg | Freq: Once | INTRAMUSCULAR | Status: AC | PRN
  Administered 2019-12-13: 25 mg via INTRAVENOUS
  Filled 2019-12-13: qty 1

## 2019-12-13 MED ORDER — SODIUM CHLORIDE 0.9 % IV SOLN
INTRAVENOUS | Status: DC | PRN
  Administered 2019-12-13 (×2): 250 mL via INTRAVENOUS

## 2019-12-13 NOTE — Discharge Instructions (Signed)
Ocrelizumab injection °What is this medicine? °OCRELIZUMAB (ok re LIZ ue mab) treats multiple sclerosis. It helps to decrease the number of multiple sclerosis relapses. It is not a cure. °This medicine may be used for other purposes; ask your health care provider or pharmacist if you have questions. °COMMON BRAND NAME(S): OCREVUS °What should I tell my health care provider before I take this medicine? °They need to know if you have any of these conditions: °· cancer °· hepatitis B infection °· other infection (especially a virus infection such as chickenpox, cold sores, or herpes) °· an unusual or allergic reaction to ocrelizumab, other medicines, foods, dyes or preservatives °· pregnant or trying to get pregnant °· breast-feeding °How should I use this medicine? °This medicine is for infusion into a vein. It is given by a health care professional in a hospital or clinic setting. °A special MedGuide will be given to you before each treatment. Be sure to read this information carefully each time. °Talk to your pediatrician regarding the use of this medicine in children. Special care may be needed. °Overdosage: If you think you have taken too much of this medicine contact a poison control center or emergency room at once. °NOTE: This medicine is only for you. Do not share this medicine with others. °What if I miss a dose? °Keep appointments for follow-up doses as directed. It is important not to miss your dose. Call your doctor or health care professional if you are unable to keep an appointment. °What may interact with this medicine? °· alemtuzumab °· daclizumab °· dimethyl fumarate °· fingolimod °· glatiramer °· interferon beta °· live virus vaccines °· mitoxantrone °· natalizumab °· peginterferon beta °· rituximab °· steroid medicines like prednisone or cortisone °· teriflunomide °This list may not describe all possible interactions. Give your health care provider a list of all the medicines, herbs,  non-prescription drugs, or dietary supplements you use. Also tell them if you smoke, drink alcohol, or use illegal drugs. Some items may interact with your medicine. °What should I watch for while using this medicine? °Tell your doctor or healthcare professional if your symptoms do not start to get better or if they get worse. °This medicine can cause serious allergic reactions. To reduce your risk you may need to take medicine before treatment with this medicine. Take your medicine as directed. °Women should inform their doctor if they wish to become pregnant or think they might be pregnant. There is a potential for serious side effects to an unborn child. Talk to your health care professional or pharmacist for more information. Female patients should use effective birth control methods while receiving this medicine and for 6 months after the last dose. °Call your doctor or health care professional for advice if you get a fever, chills or sore throat, or other symptoms of a cold or flu. Do not treat yourself. This drug decreases your body's ability to fight infections. Try to avoid being around people who are sick. °If you have a hepatitis B infection or a history of a hepatitis B infection, talk to your doctor. The symptoms of hepatitis B may get worse if you take this medicine. °In some patients, this medicine may cause a serious brain infection that may cause death. If you have any problems seeing, thinking, speaking, walking, or standing, tell your doctor right away. If you cannot reach your doctor, urgently seek other source of medical care. °This medicine can decrease the response to a vaccine. If you need to get   vaccinated, tell your healthcare professional if you have received this medicine. Extra booster doses may be needed. Talk to your doctor to see if a different vaccination schedule is needed. °Talk to your doctor about your risk of cancer. You may be more at risk for certain types of cancers if you  take this medicine. °What side effects may I notice from receiving this medicine? °Side effects that you should report to your doctor or health care professional as soon as possible: °· allergic reactions like skin rash, itching or hives, swelling of the face, lips, or tongue °· breathing problems °· facial flushing °· fast, irregular heartbeat °· lump or soreness in the breast °· signs and symptoms of herpes such as cold sore, shingles, or genital sores °· signs and symptoms of infection like fever or chills, cough, sore throat, pain or trouble passing urine °· signs and symptoms of low blood pressure like dizziness; feeling faint or lightheaded, falls; unusually weak or tired °· signs and symptoms of progressive multifocal leukoencephalopathy (PML) like changes in vision; clumsiness; confusion; personality changes; weakness on one side of the body °· swelling of the ankles, feet, hands °Side effects that usually do not require medical attention (report these to your doctor or health care professional if they continue or are bothersome): °· back pain °· depressed mood °· diarrhea °· pain, redness, or irritation at site where injected °This list may not describe all possible side effects. Call your doctor for medical advice about side effects. You may report side effects to FDA at 1-800-FDA-1088. °Where should I keep my medicine? °This drug is given in a hospital or clinic and will not be stored at home. °NOTE: This sheet is a summary. It may not cover all possible information. If you have questions about this medicine, talk to your doctor, pharmacist, or health care provider. °© 2020 Elsevier/Gold Standard (2018-05-04 07:41:53) ° °

## 2019-12-13 NOTE — Progress Notes (Signed)
Patient received IV Ocrevus as ordered by Trudie Buckler MD. Pre infusion medications were given. At the 150 ml/hr infusion rate, patient's abdomen became reddish with some rashes. IV was stopped, PRN benadryl was given. MD's office was notified. Patient did not have other symptoms, the rashes went away and per MD, patient was given additional solu-medrol, waited for 30 minutes, resumed the Ocrevus at 30 ml/hr with an increment every 30 minutes as ordered. Observed for at least 60 minutes post infusion.Tolerated well, vitals stable, discharge instructions given, verbalized understanding. Patient alert, oriented and ambulatory at the time of discharge.

## 2019-12-28 ENCOUNTER — Ambulatory Visit (HOSPITAL_COMMUNITY)
Admission: RE | Admit: 2019-12-28 | Discharge: 2019-12-28 | Disposition: A | Discharge status: Home / Self Care | Payer: Medicare PPO | Source: Ambulatory Visit | Attending: Internal Medicine | Admitting: Internal Medicine

## 2019-12-28 ENCOUNTER — Other Ambulatory Visit: Payer: Self-pay

## 2019-12-28 DIAGNOSIS — G35 Multiple sclerosis: Secondary | ICD-10-CM | POA: Diagnosis not present

## 2019-12-28 MED ORDER — METHYLPREDNISOLONE SODIUM SUCC 125 MG IJ SOLR
100.0000 mg | Freq: Once | INTRAMUSCULAR | Status: AC
  Administered 2019-12-28: 100 mg via INTRAVENOUS
  Filled 2019-12-28: qty 2

## 2019-12-28 MED ORDER — DIPHENHYDRAMINE HCL 50 MG/ML IJ SOLN
25.0000 mg | Freq: Once | INTRAMUSCULAR | Status: AC
  Administered 2019-12-28: 25 mg via INTRAVENOUS
  Filled 2019-12-28: qty 1

## 2019-12-28 MED ORDER — SODIUM CHLORIDE 0.9 % IV SOLN
300.0000 mg | Freq: Once | INTRAVENOUS | Status: AC
  Administered 2019-12-28: 300 mg via INTRAVENOUS
  Filled 2019-12-28: qty 10

## 2019-12-28 MED ORDER — ACETAMINOPHEN 325 MG PO TABS
650.0000 mg | ORAL_TABLET | Freq: Once | ORAL | Status: AC
  Administered 2019-12-28: 650 mg via ORAL
  Filled 2019-12-28: qty 2

## 2019-12-28 MED ORDER — SODIUM CHLORIDE 0.9 % IV SOLN
INTRAVENOUS | Status: DC | PRN
  Administered 2019-12-28: 250 mL via INTRAVENOUS

## 2019-12-28 MED ORDER — DIPHENHYDRAMINE HCL 50 MG/ML IJ SOLN: 25.0000 mg | Freq: Once | INTRAMUSCULAR | Status: DC | PRN

## 2019-12-28 NOTE — Progress Notes (Signed)
Patient received  IV Ocrevus as ordered by Trudie Buckler MD. Pre infusion medications were given. Observed for at least 60 minutes post infusion.Tolerated well, vitals stable, discharge instructions given, verbalized understanding. Patient alert, oriented and ambulatory at the time of discharge.

## 2019-12-28 NOTE — Discharge Instructions (Signed)
Ocrelizumab injection °What is this medicine? °OCRELIZUMAB (ok re LIZ ue mab) treats multiple sclerosis. It helps to decrease the number of multiple sclerosis relapses. It is not a cure. °This medicine may be used for other purposes; ask your health care provider or pharmacist if you have questions. °COMMON BRAND NAME(S): OCREVUS °What should I tell my health care provider before I take this medicine? °They need to know if you have any of these conditions: °· cancer °· hepatitis B infection °· other infection (especially a virus infection such as chickenpox, cold sores, or herpes) °· an unusual or allergic reaction to ocrelizumab, other medicines, foods, dyes or preservatives °· pregnant or trying to get pregnant °· breast-feeding °How should I use this medicine? °This medicine is for infusion into a vein. It is given by a health care professional in a hospital or clinic setting. °A special MedGuide will be given to you before each treatment. Be sure to read this information carefully each time. °Talk to your pediatrician regarding the use of this medicine in children. Special care may be needed. °Overdosage: If you think you have taken too much of this medicine contact a poison control center or emergency room at once. °NOTE: This medicine is only for you. Do not share this medicine with others. °What if I miss a dose? °Keep appointments for follow-up doses as directed. It is important not to miss your dose. Call your doctor or health care professional if you are unable to keep an appointment. °What may interact with this medicine? °· alemtuzumab °· daclizumab °· dimethyl fumarate °· fingolimod °· glatiramer °· interferon beta °· live virus vaccines °· mitoxantrone °· natalizumab °· peginterferon beta °· rituximab °· steroid medicines like prednisone or cortisone °· teriflunomide °This list may not describe all possible interactions. Give your health care provider a list of all the medicines, herbs,  non-prescription drugs, or dietary supplements you use. Also tell them if you smoke, drink alcohol, or use illegal drugs. Some items may interact with your medicine. °What should I watch for while using this medicine? °Tell your doctor or healthcare professional if your symptoms do not start to get better or if they get worse. °This medicine can cause serious allergic reactions. To reduce your risk you may need to take medicine before treatment with this medicine. Take your medicine as directed. °Women should inform their doctor if they wish to become pregnant or think they might be pregnant. There is a potential for serious side effects to an unborn child. Talk to your health care professional or pharmacist for more information. Female patients should use effective birth control methods while receiving this medicine and for 6 months after the last dose. °Call your doctor or health care professional for advice if you get a fever, chills or sore throat, or other symptoms of a cold or flu. Do not treat yourself. This drug decreases your body's ability to fight infections. Try to avoid being around people who are sick. °If you have a hepatitis B infection or a history of a hepatitis B infection, talk to your doctor. The symptoms of hepatitis B may get worse if you take this medicine. °In some patients, this medicine may cause a serious brain infection that may cause death. If you have any problems seeing, thinking, speaking, walking, or standing, tell your doctor right away. If you cannot reach your doctor, urgently seek other source of medical care. °This medicine can decrease the response to a vaccine. If you need to get   vaccinated, tell your healthcare professional if you have received this medicine. Extra booster doses may be needed. Talk to your doctor to see if a different vaccination schedule is needed. °Talk to your doctor about your risk of cancer. You may be more at risk for certain types of cancers if you  take this medicine. °What side effects may I notice from receiving this medicine? °Side effects that you should report to your doctor or health care professional as soon as possible: °· allergic reactions like skin rash, itching or hives, swelling of the face, lips, or tongue °· breathing problems °· facial flushing °· fast, irregular heartbeat °· lump or soreness in the breast °· signs and symptoms of herpes such as cold sore, shingles, or genital sores °· signs and symptoms of infection like fever or chills, cough, sore throat, pain or trouble passing urine °· signs and symptoms of low blood pressure like dizziness; feeling faint or lightheaded, falls; unusually weak or tired °· signs and symptoms of progressive multifocal leukoencephalopathy (PML) like changes in vision; clumsiness; confusion; personality changes; weakness on one side of the body °· swelling of the ankles, feet, hands °Side effects that usually do not require medical attention (report these to your doctor or health care professional if they continue or are bothersome): °· back pain °· depressed mood °· diarrhea °· pain, redness, or irritation at site where injected °This list may not describe all possible side effects. Call your doctor for medical advice about side effects. You may report side effects to FDA at 1-800-FDA-1088. °Where should I keep my medicine? °This drug is given in a hospital or clinic and will not be stored at home. °NOTE: This sheet is a summary. It may not cover all possible information. If you have questions about this medicine, talk to your doctor, pharmacist, or health care provider. °© 2020 Elsevier/Gold Standard (2018-05-04 07:41:53) ° °

## 2020-01-20 ENCOUNTER — Ambulatory Visit: Payer: Medicare PPO | Admitting: Physician Assistant

## 2020-01-27 ENCOUNTER — Ambulatory Visit: Payer: Medicare PPO | Admitting: Physician Assistant

## 2020-02-17 ENCOUNTER — Ambulatory Visit: Payer: Medicare PPO | Attending: Internal Medicine

## 2020-02-17 DIAGNOSIS — Z23 Encounter for immunization: Secondary | ICD-10-CM

## 2020-02-17 NOTE — Progress Notes (Signed)
   Covid-19 Vaccination Clinic  Name:  Barbara Singleton    MRN: [id number] DOB: [birth year]  02/17/2020  [name] was observed post Covid-19 immunization for 15 minutes without incident. She was provided with Vaccine Information Sheet and instruction to access the V-Safe system.   [name] was instructed to call 911 with any severe reactions post vaccine: Marland Kitchen Difficulty breathing  . Swelling of face and throat  . A fast heartbeat  . A bad rash all over body  . Dizziness and weakness

## 2020-06-30 ENCOUNTER — Other Ambulatory Visit: Payer: Self-pay

## 2020-06-30 ENCOUNTER — Ambulatory Visit (HOSPITAL_COMMUNITY)
Admission: RE | Admit: 2020-06-30 | Discharge: 2020-06-30 | Disposition: A | Discharge status: Home / Self Care | Payer: Medicare PPO | Source: Ambulatory Visit | Attending: Internal Medicine | Admitting: Internal Medicine

## 2020-06-30 DIAGNOSIS — G35 Multiple sclerosis: Secondary | ICD-10-CM | POA: Insufficient documentation

## 2020-06-30 MED ORDER — DIPHENHYDRAMINE HCL 50 MG/ML IJ SOLN: 25.0000 mg | Freq: Once | INTRAMUSCULAR | Status: DC | PRN

## 2020-06-30 MED ORDER — METHYLPREDNISOLONE SODIUM SUCC 125 MG IJ SOLR
100.0000 mg | Freq: Once | INTRAMUSCULAR | Status: AC
Start: 2020-06-30 — End: 2020-06-30
  Administered 2020-06-30: 100 mg via INTRAVENOUS
  Filled 2020-06-30: qty 2

## 2020-06-30 MED ORDER — DIPHENHYDRAMINE HCL 50 MG/ML IJ SOLN: 25.0000 mg | Freq: Once | INTRAMUSCULAR | Status: DC

## 2020-06-30 MED ORDER — DIPHENHYDRAMINE HCL 50 MG/ML IJ SOLN
25.0000 mg | Freq: Once | INTRAMUSCULAR | Status: AC
  Administered 2020-06-30: 25 mg via INTRAVENOUS
  Filled 2020-06-30: qty 1

## 2020-06-30 MED ORDER — SODIUM CHLORIDE 0.9 % IV SOLN
600.0000 mg | Freq: Once | INTRAVENOUS | Status: AC
  Administered 2020-06-30: 600 mg via INTRAVENOUS
  Filled 2020-06-30: qty 20

## 2020-06-30 MED ORDER — OCRELIZUMAB 300 MG/10ML IV SOLN
300.0000 mg | Freq: Once | INTRAVENOUS | Status: DC
  Filled 2020-06-30: qty 10

## 2020-06-30 MED ORDER — SODIUM CHLORIDE 0.9 % IV SOLN
INTRAVENOUS | Status: DC | PRN
  Administered 2020-06-30: 250 mL via INTRAVENOUS

## 2020-06-30 MED ORDER — ACETAMINOPHEN 325 MG PO TABS
650.0000 mg | ORAL_TABLET | Freq: Once | ORAL | Status: AC
  Administered 2020-06-30: 650 mg via ORAL
  Filled 2020-06-30: qty 2

## 2020-06-30 NOTE — Discharge Instructions (Signed)
Ocrelizumab injection What is this medicine? OCRELIZUMAB (ok re LIZ ue mab) treats multiple sclerosis. It helps to decrease the number of multiple sclerosis relapses. It is not a cure. This medicine may be used for other purposes; ask your health care provider or pharmacist if you have questions. COMMON BRAND NAME(S): OCREVUS What should I tell my health care provider before I take this medicine? They need to know if you have any of these conditions:  cancer  hepatitis B infection  other infection (especially a virus infection such as chickenpox, cold sores, or herpes)  an unusual or allergic reaction to ocrelizumab, other medicines, foods, dyes or preservatives  pregnant or trying to get pregnant  breast-feeding How should I use this medicine? This medicine is for infusion into a vein. It is given by a health care professional in a hospital or clinic setting. A special MedGuide will be given to you before each treatment. Be sure to read this information carefully each time. Talk to your pediatrician regarding the use of this medicine in children. Special care may be needed. Overdosage: If you think you have taken too much of this medicine contact a poison control center or emergency room at once. NOTE: This medicine is only for you. Do not share this medicine with others. What if I miss a dose? Keep appointments for follow-up doses as directed. It is important not to miss your dose. Call your doctor or health care professional if you are unable to keep an appointment. What may interact with this medicine?  alemtuzumab  daclizumab  dimethyl fumarate  fingolimod  glatiramer  interferon beta  live virus vaccines  mitoxantrone  natalizumab  peginterferon beta  rituximab  steroid medicines like prednisone or cortisone  teriflunomide This list may not describe all possible interactions. Give your health care provider a list of all the medicines, herbs,  non-prescription drugs, or dietary supplements you use. Also tell them if you smoke, drink alcohol, or use illegal drugs. Some items may interact with your medicine. What should I watch for while using this medicine? Your condition will be monitored carefully while you are receiving this drug. This drug can cause serious allergic reactions. To reduce your risk, you may need to take drug before treatment with this drug. Take your drug as directed. Do not become pregnant while taking this drug or for 6 months after stopping it. Women should inform their health care provider if they wish to become pregnant or think they might be pregnant. There is potential for serious harm to an unborn child. Talk to your health care provider for more information. This drug may increase your risk of getting an infection. Call your health care provider for advice if you get a fever, chills, or sore throat, or other symptoms of a cold or flu. Do not treat yourself. Try to avoid being around people who are sick. If you have hepatitis B, talk to your health care provider if you plan to stop this drug. The symptoms of hepatitis B may get worse if you stop this drug. In some patients, this drug may cause a serious brain infection that may cause death. If you have any problems seeing, thinking, speaking, walking, or standing, tell your health care provider right away. If you cannot reach your health care provider, urgently seek other source of medical care. This drug can decrease the response to a vaccine. If you need to get vaccinated, tell your health care provider if you have received this drug. Extra   booster doses may be needed. Talk to your health care provider to see if a different vaccination schedule is needed. Talk to your health care provider about your risk of cancer. You may be more at risk for certain types of cancer if you take this drug. What side effects may I notice from receiving this medicine? Side effects that  you should report to your doctor or health care professional as soon as possible:  allergic reactions (skin rash, itching or hives; swelling of the face, lips, or tongue)  facial flushing  fast, irregular heartbeat  infection (fever, chills, cough, sore throat, pain or trouble passing urine)  liver injury (dark yellow or brown urine; general ill feeling or flu-like symptoms; loss of appetite, right upper belly pain; unusually weak or tired, yellowing of the eyes or skin)  low blood pressure (dizziness; feeling faint or lightheaded, falls; unusually weak or tired)  swelling of the ankles, feet, hands  trouble breathing Side effects that usually do not require medical attention (report these to your doctor or health care professional if they continue or are bothersome):  back pain  depressed mood  diarrhea  pain, redness, or irritation at site where injected This list may not describe all possible side effects. Call your doctor for medical advice about side effects. You may report side effects to FDA at 1-800-FDA-1088. Where should I keep my medicine? This drug is given in a hospital or clinic and will not be stored at home. NOTE: This sheet is a summary. It may not cover all possible information. If you have questions about this medicine, talk to your doctor, pharmacist, or health care provider.  2021 Elsevier/Gold Standard (2019-03-30 13:09:45)  

## 2020-06-30 NOTE — Progress Notes (Signed)
PATIENT CARE CENTER NOTE  Diagnosis: Multiple Sclerosis G35   Provider: Trudie Buckler, MD   Procedure: Ocrevus 600 mg infusion    Note: Patient received Ocrevus infusion via PIV. Pre-medications (Tylenol, Benadryl, Solumedrol) given per order. Patient tolerated infusion well with no adverse reaction. Observed patient for 1 hour post-infusion. Vital signs remained stable. Discharge instructions given. Patient alert, oriented and ambulatory at discharge.

## 2020-09-21 ENCOUNTER — Other Ambulatory Visit: Payer: Self-pay | Admitting: Family Medicine

## 2020-09-21 DIAGNOSIS — Z1231 Encounter for screening mammogram for malignant neoplasm of breast: Secondary | ICD-10-CM

## 2020-09-30 ENCOUNTER — Other Ambulatory Visit: Payer: Self-pay

## 2020-09-30 ENCOUNTER — Ambulatory Visit
Admission: RE | Admit: 2020-09-30 | Discharge: 2020-09-30 | Disposition: A | Discharge status: Home / Self Care | Payer: Medicare PPO | Source: Ambulatory Visit | Attending: Family Medicine | Admitting: Family Medicine

## 2020-09-30 DIAGNOSIS — Z1231 Encounter for screening mammogram for malignant neoplasm of breast: Secondary | ICD-10-CM | POA: Diagnosis not present

## 2020-10-05 DIAGNOSIS — F419 Anxiety disorder, unspecified: Secondary | ICD-10-CM | POA: Diagnosis not present

## 2020-10-05 DIAGNOSIS — F429 Obsessive-compulsive disorder, unspecified: Secondary | ICD-10-CM | POA: Diagnosis not present

## 2020-10-05 DIAGNOSIS — F331 Major depressive disorder, recurrent, moderate: Secondary | ICD-10-CM | POA: Diagnosis not present

## 2020-11-14 ENCOUNTER — Inpatient Hospital Stay (HOSPITAL_COMMUNITY): Payer: Medicare PPO

## 2020-11-14 ENCOUNTER — Encounter (HOSPITAL_COMMUNITY): Payer: Self-pay | Admitting: *Deleted

## 2020-11-14 ENCOUNTER — Emergency Department (HOSPITAL_COMMUNITY): Payer: Medicare PPO

## 2020-11-14 ENCOUNTER — Other Ambulatory Visit: Payer: Self-pay

## 2020-11-14 ENCOUNTER — Observation Stay (HOSPITAL_COMMUNITY)
Admission: EM | Admit: 2020-11-14 | Discharge: 2020-11-16 | Disposition: A | Discharge status: Home / Self Care | Payer: Medicare PPO | Attending: Internal Medicine | Admitting: Internal Medicine

## 2020-11-14 DIAGNOSIS — Q283 Other malformations of cerebral vessels: Secondary | ICD-10-CM | POA: Diagnosis not present

## 2020-11-14 DIAGNOSIS — Z87891 Personal history of nicotine dependence: Secondary | ICD-10-CM | POA: Diagnosis not present

## 2020-11-14 DIAGNOSIS — Z8673 Personal history of transient ischemic attack (TIA), and cerebral infarction without residual deficits: Secondary | ICD-10-CM | POA: Diagnosis not present

## 2020-11-14 DIAGNOSIS — R531 Weakness: Secondary | ICD-10-CM | POA: Diagnosis not present

## 2020-11-14 DIAGNOSIS — Z79899 Other long term (current) drug therapy: Secondary | ICD-10-CM | POA: Insufficient documentation

## 2020-11-14 DIAGNOSIS — I639 Cerebral infarction, unspecified: Secondary | ICD-10-CM | POA: Diagnosis not present

## 2020-11-14 DIAGNOSIS — I10 Essential (primary) hypertension: Secondary | ICD-10-CM | POA: Diagnosis not present

## 2020-11-14 DIAGNOSIS — G35 Multiple sclerosis: Secondary | ICD-10-CM | POA: Diagnosis not present

## 2020-11-14 DIAGNOSIS — R001 Bradycardia, unspecified: Secondary | ICD-10-CM | POA: Diagnosis not present

## 2020-11-14 DIAGNOSIS — J019 Acute sinusitis, unspecified: Secondary | ICD-10-CM | POA: Diagnosis not present

## 2020-11-14 DIAGNOSIS — R2681 Unsteadiness on feet: Secondary | ICD-10-CM | POA: Diagnosis not present

## 2020-11-14 DIAGNOSIS — Z20822 Contact with and (suspected) exposure to covid-19: Secondary | ICD-10-CM | POA: Diagnosis not present

## 2020-11-14 DIAGNOSIS — R270 Ataxia, unspecified: Secondary | ICD-10-CM | POA: Diagnosis not present

## 2020-11-14 DIAGNOSIS — R262 Difficulty in walking, not elsewhere classified: Secondary | ICD-10-CM | POA: Insufficient documentation

## 2020-11-14 DIAGNOSIS — R29810 Facial weakness: Secondary | ICD-10-CM | POA: Diagnosis not present

## 2020-11-14 DIAGNOSIS — M4312 Spondylolisthesis, cervical region: Secondary | ICD-10-CM | POA: Diagnosis not present

## 2020-11-14 DIAGNOSIS — R29818 Other symptoms and signs involving the nervous system: Secondary | ICD-10-CM | POA: Diagnosis not present

## 2020-11-14 DIAGNOSIS — H538 Other visual disturbances: Secondary | ICD-10-CM | POA: Diagnosis not present

## 2020-11-14 DIAGNOSIS — R519 Headache, unspecified: Secondary | ICD-10-CM | POA: Diagnosis not present

## 2020-11-14 DIAGNOSIS — H532 Diplopia: Secondary | ICD-10-CM | POA: Diagnosis not present

## 2020-11-14 DIAGNOSIS — G319 Degenerative disease of nervous system, unspecified: Secondary | ICD-10-CM | POA: Diagnosis not present

## 2020-11-14 DIAGNOSIS — G4489 Other headache syndrome: Secondary | ICD-10-CM | POA: Diagnosis not present

## 2020-11-14 HISTORY — DX: Cerebral infarction, unspecified: I63.9

## 2020-11-14 LAB — COMPREHENSIVE METABOLIC PANEL
ALT: 24 U/L (ref 0–44)
AST: 24 U/L (ref 15–41)
Albumin: 3.8 g/dL (ref 3.5–5.0)
Alkaline Phosphatase: 80 U/L (ref 38–126)
Anion gap: 3 — ABNORMAL LOW (ref 5–15)
BUN: 12 mg/dL (ref 8–23)
CO2: 31 mmol/L (ref 22–32)
Calcium: 9.4 mg/dL (ref 8.9–10.3)
Chloride: 106 mmol/L (ref 98–111)
Creatinine, Ser: 0.75 mg/dL (ref 0.44–1.00)
GFR, Estimated: >60 mL/min (ref >60)
Glucose, Bld: 107 mg/dL — ABNORMAL HIGH (ref 70–99)
Potassium: 4.3 mmol/L (ref 3.5–5.1)
Sodium: 140 mmol/L (ref 135–145)
Total Bilirubin: 0.4 mg/dL (ref 0.3–1.2)
Total Protein: 6.4 g/dL — ABNORMAL LOW (ref 6.5–8.1)

## 2020-11-14 LAB — CBC
HCT: 42.7 % (ref 36.0–46.0)
Hemoglobin: 13.6 g/dL (ref 12.0–15.0)
MCH: 29.6 pg (ref 26.0–34.0)
MCHC: 31.9 g/dL (ref 30.0–36.0)
MCV: 92.8 fL (ref 80.0–100.0)
Platelets: 227 10*3/uL (ref 150–400)
RBC: 4.6 MIL/uL (ref 3.87–5.11)
RDW: 12.9 % (ref 11.5–15.5)
WBC: 4.1 10*3/uL (ref 4.0–10.5)
nRBC: 0 % (ref 0.0–0.2)

## 2020-11-14 LAB — I-STAT CHEM 8, ED
BUN: 12 mg/dL (ref 8–23)
Calcium, Ion: 1.19 mmol/L (ref 1.15–1.40)
Chloride: 107 mmol/L (ref 98–111)
Creatinine, Ser: 0.7 mg/dL (ref 0.44–1.00)
Glucose, Bld: 106 mg/dL — ABNORMAL HIGH (ref 70–99)
HCT: 41 % (ref 36.0–46.0)
Hemoglobin: 13.9 g/dL (ref 12.0–15.0)
Potassium: 4.4 mmol/L (ref 3.5–5.1)
Sodium: 141 mmol/L (ref 135–145)
TCO2: 29 mmol/L (ref 22–32)

## 2020-11-14 LAB — PROTIME-INR
INR: 1 (ref 0.8–1.2)
Prothrombin Time: 13.2 seconds (ref 11.4–15.2)

## 2020-11-14 LAB — DIFFERENTIAL
Abs Immature Granulocytes: 0.03 10*3/uL (ref 0.00–0.07)
Basophils Absolute: 0.1 10*3/uL (ref 0.0–0.1)
Basophils Relative: 1 %
Eosinophils Absolute: 0.1 10*3/uL (ref 0.0–0.5)
Eosinophils Relative: 3 %
Immature Granulocytes: 1 %
Lymphocytes Relative: 22 %
Lymphs Abs: 0.9 10*3/uL (ref 0.7–4.0)
Monocytes Absolute: 0.6 10*3/uL (ref 0.1–1.0)
Monocytes Relative: 14 %
Neutro Abs: 2.4 10*3/uL (ref 1.7–7.7)
Neutrophils Relative %: 59 %

## 2020-11-14 LAB — URINALYSIS, ROUTINE W REFLEX MICROSCOPIC
Bilirubin Urine: NEGATIVE
Glucose, UA: NEGATIVE mg/dL
Hgb urine dipstick: NEGATIVE
Ketones, ur: NEGATIVE mg/dL
Leukocytes,Ua: NEGATIVE
Nitrite: NEGATIVE
Protein, ur: NEGATIVE mg/dL
Specific Gravity, Urine: >1.046 — ABNORMAL HIGH (ref 1.005–1.030)
pH: 7 (ref 5.0–8.0)

## 2020-11-14 LAB — SARS CORONAVIRUS 2 (TAT 6-24 HRS): SARS Coronavirus 2: NEGATIVE

## 2020-11-14 LAB — APTT: aPTT: 25 seconds (ref 24–36)

## 2020-11-14 MED ORDER — ADULT MULTIVITAMIN W/MINERALS CH
1.0000 | ORAL_TABLET | Freq: Every day | ORAL | Status: DC
  Administered 2020-11-14 – 2020-11-16 (×3): 1 via ORAL
  Filled 2020-11-14 (×3): qty 1

## 2020-11-14 MED ORDER — SODIUM CHLORIDE 0.9% FLUSH: 3.0000 mL | Freq: Once | INTRAVENOUS | Status: DC

## 2020-11-14 MED ORDER — LORAZEPAM 0.5 MG PO TABS: 0.5000 mg | ORAL_TABLET | Freq: Every day | ORAL | Status: DC | PRN

## 2020-11-14 MED ORDER — VITAMIN B-12 100 MCG PO TABS
100.0000 ug | ORAL_TABLET | Freq: Every day | ORAL | Status: DC
  Administered 2020-11-14 – 2020-11-16 (×3): 100 ug via ORAL
  Filled 2020-11-14 (×3): qty 1

## 2020-11-14 MED ORDER — ASPIRIN EC 81 MG PO TBEC
81.0000 mg | DELAYED_RELEASE_TABLET | Freq: Every day | ORAL | Status: DC
  Administered 2020-11-14 – 2020-11-16 (×3): 81 mg via ORAL
  Filled 2020-11-14 (×3): qty 1

## 2020-11-14 MED ORDER — METHYLPHENIDATE HCL 5 MG PO TABS
20.0000 mg | ORAL_TABLET | Freq: Every day | ORAL | Status: DC
  Administered 2020-11-15 – 2020-11-16 (×2): 20 mg via ORAL
  Filled 2020-11-14 (×2): qty 4

## 2020-11-14 MED ORDER — FLUVOXAMINE MALEATE 100 MG PO TABS
100.0000 mg | ORAL_TABLET | Freq: Three times a day (TID) | ORAL | Status: DC
  Administered 2020-11-14 – 2020-11-16 (×6): 100 mg via ORAL
  Filled 2020-11-14 (×9): qty 1

## 2020-11-14 MED ORDER — STROKE: EARLY STAGES OF RECOVERY BOOK
Freq: Once | Status: AC
  Filled 2020-11-14: qty 1

## 2020-11-14 MED ORDER — ENOXAPARIN SODIUM 40 MG/0.4ML IJ SOSY
40.0000 mg | PREFILLED_SYRINGE | INTRAMUSCULAR | Status: DC
  Administered 2020-11-14 – 2020-11-15 (×2): 40 mg via SUBCUTANEOUS
  Filled 2020-11-14 (×2): qty 0.4

## 2020-11-14 MED ORDER — CALCIUM CARBONATE-VITAMIN D 500-200 MG-UNIT PO TABS
1.0000 | ORAL_TABLET | Freq: Every day | ORAL | Status: DC
  Administered 2020-11-14 – 2020-11-16 (×3): 1 via ORAL
  Filled 2020-11-14 (×3): qty 1

## 2020-11-14 MED ORDER — GADOBUTROL 1 MMOL/ML IV SOLN
7.0000 mL | Freq: Once | INTRAVENOUS | Status: AC | PRN
  Administered 2020-11-14: 7 mL via INTRAVENOUS

## 2020-11-14 MED ORDER — IOHEXOL 350 MG/ML SOLN
75.0000 mL | Freq: Once | INTRAVENOUS | Status: AC | PRN
  Administered 2020-11-14: 75 mL via INTRAVENOUS

## 2020-11-14 MED ORDER — ACETAMINOPHEN 650 MG RE SUPP
650.0000 mg | RECTAL | Status: DC | PRN
Start: 2020-11-14 — End: 2020-11-16

## 2020-11-14 MED ORDER — LURASIDONE HCL 40 MG PO TABS
80.0000 mg | ORAL_TABLET | Freq: Every day | ORAL | Status: DC
  Administered 2020-11-15 – 2020-11-16 (×2): 80 mg via ORAL
  Filled 2020-11-14 (×2): qty 2

## 2020-11-14 MED ORDER — HYDRALAZINE HCL 25 MG PO TABS: 25.0000 mg | ORAL_TABLET | Freq: Four times a day (QID) | ORAL | Status: DC | PRN

## 2020-11-14 MED ORDER — SENNOSIDES-DOCUSATE SODIUM 8.6-50 MG PO TABS: 1.0000 | ORAL_TABLET | Freq: Every evening | ORAL | Status: DC | PRN

## 2020-11-14 MED ORDER — ASCORBIC ACID 500 MG PO TABS
500.0000 mg | ORAL_TABLET | Freq: Every day | ORAL | Status: DC
  Administered 2020-11-14 – 2020-11-16 (×3): 500 mg via ORAL
  Filled 2020-11-14 (×3): qty 1

## 2020-11-14 MED ORDER — ACETAMINOPHEN 160 MG/5ML PO SOLN: 650.0000 mg | ORAL | Status: DC | PRN

## 2020-11-14 MED ORDER — ACETAMINOPHEN 325 MG PO TABS: 650.0000 mg | ORAL_TABLET | ORAL | Status: DC | PRN

## 2020-11-14 MED ORDER — LAMOTRIGINE 100 MG PO TABS
100.0000 mg | ORAL_TABLET | Freq: Two times a day (BID) | ORAL | Status: DC
  Administered 2020-11-14 – 2020-11-16 (×4): 100 mg via ORAL
  Filled 2020-11-14 (×5): qty 1

## 2020-11-14 MED ORDER — TRAZODONE HCL 100 MG PO TABS
100.0000 mg | ORAL_TABLET | Freq: Every day | ORAL | Status: DC
  Administered 2020-11-14 – 2020-11-15 (×2): 100 mg via ORAL
  Filled 2020-11-14 (×2): qty 1

## 2020-11-14 MED ORDER — CLOPIDOGREL BISULFATE 75 MG PO TABS
75.0000 mg | ORAL_TABLET | Freq: Every day | ORAL | Status: DC
  Administered 2020-11-14 – 2020-11-16 (×3): 75 mg via ORAL
  Filled 2020-11-14 (×3): qty 1

## 2020-11-14 MED ORDER — ATORVASTATIN CALCIUM 10 MG PO TABS
10.0000 mg | ORAL_TABLET | Freq: Every day | ORAL | Status: DC
  Administered 2020-11-14 – 2020-11-15 (×2): 10 mg via ORAL
  Filled 2020-11-14 (×2): qty 1

## 2020-11-14 NOTE — ED Notes (Signed)
Teletracking to be put in for pt

## 2020-11-14 NOTE — ED Provider Notes (Signed)
[facility] EMERGENCY DEPARTMENT Provider Note   CSN: [id number] Arrival date & time: 11/14/20  0909     History No chief complaint on file.   Barbara Singleton is a 70 y.o. female.  Presents to ER with concern for double vision and right-sided weakness.  Patient reports that symptoms started when she woke up this morning at 5 AM.  Went to bed around midnight.  Did not have any symptoms yesterday.  Does have history of multiple sclerosis, no recent flares.  States that she had difficulty walking due to the weakness.  No change in speech.  No loss of vision.  No pain anywhere.  No numbness.  HPI     Past Medical History:  Diagnosis Date   Arthritis    Osteoarthritis- knees.   Depression    Multiple sclerosis (HCC)    Neuromuscular disorder [facility])    Dr. Algis Downs Jeffrey,neurology- [place], neurology foolows last visit 3 montha ago.    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   BREAST EXCISIONAL BIOPSY Left    CESAREAN SECTION     DENTAL SURGERY     extractions   TONSILLECTOMY     TOTAL KNEE ARTHROPLASTY Right 07/18/2015   Procedure: TOTAL KNEE ARTHROPLASTY;  Surgeon: Durene Romans, MD;  Location: [facility] ORS;  Service: Orthopedics;  Laterality: Right;   TUBAL LIGATION       OB History   No obstetric history on file.     Family History  Problem Relation Age of Onset   CVA Mother    Brain cancer Father     Social History   Tobacco Use   Smoking status: Former    Pack years: 0.00    Types: Cigarettes   Tobacco comments:    social"-college years"  Substance Use Topics   Alcohol use: No   Drug use: No    Home Medications Prior to Admission medications   Medication Sig Start Date End Date Taking? Authorizing Provider  atorvastatin (LIPITOR) 10 MG tablet Take 10 mg by  mouth daily. 11/06/20  Yes [provider]  Calcium Citrate-Vitamin D (CALCIUM + D PO) Take 1 tablet by mouth daily.   Yes [provider]  Cyanocobalamin (VITAMIN B 12 PO) Take 1 tablet by mouth daily.   Yes [provider]  fluvoxaMINE (LUVOX) 100 MG tablet Take 100 mg by mouth 3 (three) times daily.    Yes [provider]  lamoTRIgine (LAMICTAL) 100 MG tablet Take 100 mg by mouth 2 (two) times daily.    Yes [provider]  LATUDA 80 MG TABS tablet Take 80 mg by mouth daily. 10/24/20  Yes [provider]  LORazepam (ATIVAN) 0.5 MG tablet Take 0.5 mg by mouth daily as needed for anxiety. 10/17/20  Yes [provider]  methylphenidate (RITALIN) 20 MG tablet Take 20 mg by mouth daily. 11/06/20  Yes [provider]  Multiple Vitamin (MULTIVITAMIN WITH MINERALS) TABS tablet Take 1 tablet by mouth daily.   Yes [provider]  traZODone (DESYREL) 100 MG tablet Take 100 mg by mouth at bedtime. 09/24/20  Yes [provider]  vitamin C (ASCORBIC ACID) 500 MG tablet Take 500 mg by mouth daily.   Yes [provider]  acetaminophen (TYLENOL) 500 MG  tablet Take 2 tablets (1,000 mg total) by mouth every 8 (eight) hours as needed. Patient not taking: No sig reported 07/19/15   Lanney Gins, PA-C  celecoxib (CELEBREX) 200 MG capsule Take 1 capsule (200 mg total) by mouth every 12 (twelve) hours. Patient not taking: No sig reported 07/19/15   Lanney Gins, PA-C  docusate sodium (COLACE) 100 MG capsule Take 1 capsule (100 mg total) by mouth 2 (two) times daily. Patient not taking: No sig reported 07/19/15   Lanney Gins, PA-C  ferrous sulfate 325 (65 FE) MG tablet Take 1 tablet (325 mg total) by mouth 3 (three) times daily after meals. Patient not taking: No sig reported 07/19/15   Lanney Gins, PA-C  methocarbamol (ROBAXIN) 500 MG tablet Take 1 tablet (500 mg total) by mouth every 6 (six) hours as needed for muscle  spasms. Patient not taking: No sig reported 07/19/15   Lanney Gins, PA-C  oxyCODONE (OXY IR/ROXICODONE) 5 MG immediate release tablet Take 1-3 tablets (5-15 mg total) by mouth every 4 (four) hours as needed for moderate pain or severe pain. Patient not taking: No sig reported 07/19/15   Lanney Gins, PA-C  polyethylene glycol (MIRALAX / GLYCOLAX) packet Take 17 g by mouth 2 (two) times daily. Patient not taking: No sig reported 07/19/15   Lanney Gins, PA-C    Allergies    Patient has no known allergies.  Review of Systems   Review of Systems  Constitutional:  Negative for chills and fever.  HENT:  Negative for ear pain and sore throat.   Eyes:  Positive for visual disturbance. Negative for pain.  Respiratory:  Negative for cough and shortness of breath.   Cardiovascular:  Negative for chest pain and palpitations.  Gastrointestinal:  Negative for abdominal pain and vomiting.  Genitourinary:  Negative for dysuria and hematuria.  Musculoskeletal:  Negative for arthralgias and back pain.  Skin:  Negative for color change and rash.  Neurological:  Positive for dizziness and weakness. Negative for seizures and syncope.  All other systems reviewed and are negative.  Physical Exam Updated Vital Signs BP (!) 159/83   Pulse (!) 52   Temp 98 F (36.7 C)   Resp 14   SpO2 100%   Physical Exam Vitals and nursing note reviewed.  Constitutional:      General: She is not in acute distress.    Appearance: She is well-developed.  HENT:     Head: Normocephalic and atraumatic.  Eyes:     Conjunctiva/sclera: Conjunctivae normal.  Cardiovascular:     Rate and Rhythm: Normal rate and regular rhythm.     Heart sounds: No murmur heard. Pulmonary:     Effort: Pulmonary effort is normal. No respiratory distress.     Breath sounds: Normal breath sounds.  Abdominal:     Palpations: Abdomen is soft.     Tenderness: There is no abdominal tenderness.  Musculoskeletal:     Cervical back: Neck  supple.  Skin:    General: Skin is warm and dry.  Neurological:     Mental Status: She is alert.     Comments: AAOx3 CN 2-12 intact, speech clear visual fields intact 5/5 strength in b/l UE and LE Sensation to light touch intact in b/l UE and LE Normal FNF    ED Results / Procedures / Treatments   Labs (all labs ordered are listed, but only abnormal results are displayed) Labs Reviewed  COMPREHENSIVE METABOLIC PANEL - Abnormal; Notable for the following components:  Result Value   Glucose, Bld 107 (*)    Total Protein 6.4 (*)    Anion gap 3 (*)    All other components within normal limits  I-STAT CHEM 8, ED - Abnormal; Notable for the following components:   Glucose, Bld 106 (*)    All other components within normal limits  PROTIME-INR  APTT  CBC  DIFFERENTIAL  CBG MONITORING, ED    EKG None  Radiology CT HEAD WO CONTRAST  Result Date: 11/14/2020 CLINICAL DATA:  Neuro deficit with acute stroke suspected. Right-sided weakness and unsteady gait. EXAM: CT HEAD WITHOUT CONTRAST TECHNIQUE: Contiguous axial images were obtained from the base of the skull through the vertex without intravenous contrast. COMPARISON:  None. FINDINGS: Brain: No evidence of acute cortical infarction, hemorrhage, hydrocephalus, extra-axial collection or mass lesion/mass effect. Notable streak artifact over the pons. Patchy low-density in the cerebral white matter attributed to chronic small vessel ischemia. Age normal brain volume. Vascular: No hyperdense vessel or unexpected calcification. Skull: Normal. Negative for fracture or focal lesion. Sinuses/Orbits: No acute finding. IMPRESSION: 1. No acute finding. 2. Chronic small vessel ischemia in the cerebral white matter. Electronically Signed   By: Marnee Spring M.D.   On: 11/14/2020 09:51   MR Brain W and Wo Contrast  Result Date: 11/14/2020 CLINICAL DATA:  Neuro deficit, acute, stroke suspected. Double vision, ataxia, history of MS, concern for  stroke or MS flare. Patient reports right leg is weaker. L vision in right eye. Headache. EXAM: MRI HEAD WITHOUT AND WITH CONTRAST TECHNIQUE: Multiplanar, multiecho pulse sequences of the brain and surrounding structures were obtained without and with intravenous contrast. CONTRAST:  62mL GADAVIST GADOBUTROL 1 MMOL/ML IV SOLN COMPARISON:  Noncontrast head CT 11/14/2020. FINDINGS: Brain: Mild generalized cerebral and cerebellar atrophy. 6 mm focus of restricted diffusion and T2/FLAIR hyperintense signal abnormality within the medial left thalamus (series 3, image 29). There is no corresponding abnormal enhancement and this likely reflects an acute infarct. Moderate multifocal T2/FLAIR hyperintensity within the cerebral white matter, nonspecific. No evidence of an intracranial mass. No chronic intracranial blood products. No extra-axial fluid collection. No midline shift. No abnormal intracranial enhancement. Vascular: Expected proximal arterial flow voids. Skull and upper cervical spine: No focal marrow lesion. Sinuses/Orbits: Visualized orbits show no acute finding. Mild-to-moderate mucosal thickening within the right maxillary sinus. Other: Trace fluid within the bilateral mastoid air cells. IMPRESSION: 6 mm focus of restricted diffusion and T2/FLAIR hyperintense signal abnormality within the medial left thalamus (without corresponding abnormal enhancement), most consistent with acute infarct. Moderate multifocal T2/FLAIR hyperintensity within the cerebral white matter. These foci have a nonspecific imaging appearance, but may reflect sequela of demyelinating disease given the provided history of multiple sclerosis. However, a component of chronic small vessel ischemic disease is also suspected. Mild generalized parenchymal atrophy. Right maxillary sinus disease, as described. Trace fluid within the bilateral mastoid air cells. Electronically Signed   By: Jackey Loge DO   On: 11/14/2020 12:40     Procedures .Critical Care  Date/Time: 11/14/2020 1:13 PM Performed by: Milagros Loll, MD Authorized by: Milagros Loll, MD   Critical care provider statement:    Critical care time (minutes):  42   Critical care was time spent personally by me on the following activities:  Discussions with consultants, evaluation of patient's response to treatment, examination of patient, ordering and performing treatments and interventions, ordering and review of laboratory studies, ordering and review of radiographic studies, pulse oximetry, re-evaluation of patient's condition, obtaining  history from patient or surrogate and review of old charts   Medications Ordered in ED Medications  sodium chloride flush (NS) 0.9 % injection 3 mL (0 mLs Intravenous Hold 11/14/20 0957)  gadobutrol (GADAVIST) 1 MMOL/ML injection 7 mL (7 mLs Intravenous Contrast Given 11/14/20 1226)    ED Course  I have reviewed the triage vital signs and the nursing notes.  Pertinent labs & imaging results that were available during my care of the patient were reviewed by me and considered in my medical decision making (see chart for details).  Clinical Course as of 11/14/20 1314  Tue Nov 14, 2020  1003 Reviewed with Neuro - Amada Jupiter - no LVO - does recommend MRI w/wo given hx [RD]    Clinical Course User Index [RD] Milagros Loll, MD   MDM Rules/Calculators/A&P                          70 year old lady presented to ER with concern for double vision and some right-sided weakness.  On exam she actually appears quite well, did not appreciate focal neurologic deficit.  Given the sudden onset nature, did review soon after arrival with on-call neurology, Dr. Amada Jupiter.  Recommended proceeding with MRI but does not need stroke alert given exam not consistent with LVO.  MRI did demonstrate small medial left thalamic acute stroke.  Will update neurology and request formal consults and admit to medicine for further  management.  Final Clinical Impression(s) / ED Diagnoses Final diagnoses:  Cerebrovascular accident (CVA), unspecified mechanism (HCC)    Rx / DC Orders ED Discharge Orders     None        Milagros Loll, MD 11/14/20 1314

## 2020-11-14 NOTE — ED Notes (Signed)
This nurse attempted to call nurse report at this time, the staff on the floor said that they were not going to take report on this patient at this time. It will need to be done on night shift.

## 2020-11-14 NOTE — ED Triage Notes (Signed)
Pt via EMS for eval of R eye visual disturbance and R sided gait issues and coordination issues. LSN last night at midnight when she went to bed. Woke up this morning at 0500 with these symptoms. Endorses headache.

## 2020-11-14 NOTE — ED Notes (Signed)
Pt transported to MRI 

## 2020-11-14 NOTE — Consult Note (Signed)
Neurology Consultation  Reason for Consult: stroke on MRI brain. Referring Physician: Myrtis Ser, MD.   CC: gait dysfunction and double vision.   History is obtained from: Patient.   HPI: Barbara Singleton is a 70 y.o. female with a PMHx of HLD, OA, depression, revision of posterior spinal fusion T10-S1 2018, and MS since 2006. Patient presented to the ED today for stroke like symptoms that began this morning. Patient states she went to bed at 12 mn and was in her usual state of health. At 0500 hours, she woke up to go to the bathroom. She had no difficulty walking there, but when she stood up to wash her hands, she noticed she was leaning to the right. She was able to get herself back to bed thinking her symptoms would be gone after more rest. Around 0700 hours, she got OOB and was still having the gait disturbance and required the assistance of her husband to walk. Husband states that if he was not helping her, she would have fallen. Also, at 0700, she had new diplopia which goes away with closing either eye.   Patient denies falling, dizziness, lightheadedness, dysarthria, mood changes, or sensation changes.   No personal history of stroke, seizure, brain surgery, brain trauma, coronary or carotid stenting. She takes Lipitor for cholesterol management and is only on 81mg  ASA qd. No history of AF or DM II. FMHx of stroke in patient's mother on chart, but patient denies.   Patient states her MS is controlled without recent flares. She states she has done really well with it. She gets infusions of Ocrevus.   Neurology asked to consult for stroke finding on MRI brain.   ROS: A robust ROS was performed and is negative except as noted in the HPI.   Past Medical History:  Diagnosis Date   Arthritis    Osteoarthritis- knees.   Depression    Multiple sclerosis (HCC)    Neuromuscular disorder [facility])    Dr. [facility] Jeffrey,neurology- [place], neurology foolows last visit 3 montha ago.     Family History  Problem Relation Age of Onset   CVA Mother    Brain cancer Father     Social History:   reports that she has quit smoking. Her smoking use included cigarettes. She does not have any smokeless tobacco history on file. She reports that she does not drink alcohol and does not use drugs.  Medications  Current Facility-Administered Medications:     stroke: mapping our early stages of recovery book, , Does not apply, Once, [place] T, MD   acetaminophen (TYLENOL) tablet 650 mg, 650 mg, Oral, Q4H PRN **OR** acetaminophen (TYLENOL) 160 MG/5ML solution 650 mg, 650 mg, Per Tube, Q4H PRN **OR** acetaminophen (TYLENOL) suppository 650 mg, 650 mg, Rectal, Q4H PRN, Mikey College T, MD   ascorbic acid (VITAMIN C) tablet 500 mg, 500 mg, Oral, Daily, Zhang, Ping T, MD   aspirin EC tablet 81 mg, 81 mg, Oral, Daily, Zhang, Ping T, MD   atorvastatin (LIPITOR) tablet 10 mg, 10 mg, Oral, Daily, Zhang, Ping T, MD   calcium-vitamin D (OSCAL WITH D) 500-200 MG-UNIT per tablet 1 tablet, 1 tablet, Oral, Daily, Zhang, Ping T, MD   enoxaparin (LOVENOX) injection 40 mg, 40 mg, Subcutaneous, Q24H, Zhang, Ping T, MD   fluvoxaMINE (LUVOX) tablet 100 mg, 100 mg, Oral, TID, 01-09-1991 T, MD   hydrALAZINE (APRESOLINE) tablet 25 mg, 25 mg, Oral, Q6H PRN, Mikey College, MD   lamoTRIgine (  LAMICTAL) tablet 100 mg, 100 mg, Oral, BID, Zhang, Ping T, MD   LORazepam (ATIVAN) tablet 0.5 mg, 0.5 mg, Oral, Daily PRN, Mikey College T, MD   lurasidone (LATUDA) tablet 80 mg, 80 mg, Oral, Daily, Zhang, Ping T, MD   methylphenidate (RITALIN) tablet 20 mg, 20 mg, Oral, Daily, Mikey College T, MD   multivitamin with minerals tablet 1 tablet, 1 tablet, Oral, Daily, Zhang, Ilda Foil T, MD   senna-docusate (Senokot-S) tablet 1 tablet, 1 tablet, Oral, QHS PRN, Mikey College T, MD   sodium chloride flush (NS) 0.9 % injection 3 mL, 3 mL, Intravenous, Once, Milagros Loll, MD   traZODone (DESYREL) tablet 100 mg, 100 mg, Oral, QHS,  Zhang, Ping T, MD   vitamin B-12 (CYANOCOBALAMIN) tablet 100 mcg, 100 mcg, Oral, Daily, Mikey College T, MD  Current Outpatient Medications:    atorvastatin (LIPITOR) 10 MG tablet, Take 10 mg by mouth daily., Disp: , Rfl:    Calcium Citrate-Vitamin D (CALCIUM + D PO), Take 1 tablet by mouth daily., Disp: , Rfl:    Cyanocobalamin (VITAMIN B 12 PO), Take 1 tablet by mouth daily., Disp: , Rfl:    fluvoxaMINE (LUVOX) 100 MG tablet, Take 100 mg by mouth 3 (three) times daily. , Disp: , Rfl:    lamoTRIgine (LAMICTAL) 100 MG tablet, Take 100 mg by mouth 2 (two) times daily. , Disp: , Rfl:    LATUDA 80 MG TABS tablet, Take 80 mg by mouth daily., Disp: , Rfl:    LORazepam (ATIVAN) 0.5 MG tablet, Take 0.5 mg by mouth daily as needed for anxiety., Disp: , Rfl:    methylphenidate (RITALIN) 20 MG tablet, Take 20 mg by mouth daily., Disp: , Rfl:    Multiple Vitamin (MULTIVITAMIN WITH MINERALS) TABS tablet, Take 1 tablet by mouth daily., Disp: , Rfl:    traZODone (DESYREL) 100 MG tablet, Take 100 mg by mouth at bedtime., Disp: , Rfl:    vitamin C (ASCORBIC ACID) 500 MG tablet, Take 500 mg by mouth daily., Disp: , Rfl:    acetaminophen (TYLENOL) 500 MG tablet, Take 2 tablets (1,000 mg total) by mouth every 8 (eight) hours as needed. (Patient not taking: No sig reported), Disp: 30 tablet, Rfl: 0   celecoxib (CELEBREX) 200 MG capsule, Take 1 capsule (200 mg total) by mouth every 12 (twelve) hours. (Patient not taking: No sig reported), Disp: 60 capsule, Rfl: 0   docusate sodium (COLACE) 100 MG capsule, Take 1 capsule (100 mg total) by mouth 2 (two) times daily. (Patient not taking: No sig reported), Disp: 10 capsule, Rfl: 0   ferrous sulfate 325 (65 FE) MG tablet, Take 1 tablet (325 mg total) by mouth 3 (three) times daily after meals. (Patient not taking: No sig reported), Disp: , Rfl: 3   methocarbamol (ROBAXIN) 500 MG tablet, Take 1 tablet (500 mg total) by mouth every 6 (six) hours as needed for muscle spasms.  (Patient not taking: No sig reported), Disp: 40 tablet, Rfl: 0   oxyCODONE (OXY IR/ROXICODONE) 5 MG immediate release tablet, Take 1-3 tablets (5-15 mg total) by mouth every 4 (four) hours as needed for moderate pain or severe pain. (Patient not taking: No sig reported), Disp: 120 tablet, Rfl: 0   polyethylene glycol (MIRALAX / GLYCOLAX) packet, Take 17 g by mouth 2 (two) times daily. (Patient not taking: No sig reported), Disp: 14 each, Rfl: 0   Exam: Current vital signs: BP (!) 168/81   Pulse 63   Temp 98  F (36.7 C)   Resp (!) 21   SpO2 97%  Vital signs in last 24 hours: Temp:  [98 F (36.7 C)] 98 F (36.7 C) (07/05 0917) Pulse Rate:  [49-63] 63 (07/05 1433) Resp:  [13-21] 21 (07/05 1433) BP: (141-168)/(71-95) 168/81 (07/05 1433) SpO2:  [97 %-100 %] 97 % (07/05 1433)  PE: GENERAL: Very well appearing female lying in ED bed. Awake, alert in NAD.  HEENT: normocephalic and atraumatic. LUNGS - Normal respiratory effort.  CV - RRR on tele. ABDOMEN - Soft, nontender. Ext: warm, well perfused. Psych: affect light.   NEURO:  Mental Status: Alert and oriented x 3.  Speech/Language: speech is without dysarthria or aphasia.  Naming, repetition, fluency, and comprehension intact.  Cranial Nerves:  II: PERRL  4 mm/brisk. visual fields full.  III, IV, VI: EOMI. Lid elevation symmetric and full.  V: sensation is intact and symmetrical to face. Blinks to threat. VII: Smile is symmetrical. Able to puff cheeks and raise eyebrows.  VIII:hearing intact to voice. IX, X: palate elevation is symmetric. Phonation normal.  XI: normal sternocleidomastoid and trapezius muscle strength. ZOX:[id number]:tongue is symmetrical without fasciculations.   Motor:  RUE: grips  5/5       triceps 5/5     biceps  5/5      LUE: grips  5/5      triceps  /5      biceps   /5 RLE:  knee  5/5   thigh  5/5    plantar flexion  5/5     dorsiflexion   5/5 LLE:  knee  5/5   thigh  5/5     plantar flexion   5/5      dorsiflexion   5/5 Tone is normal. Bulk is normal.  Sensation- Intact to light touch bilaterally in all four extremities. Extinction absent to light touch to DSS.  Coordination: FTN intact bilaterally. HKS intact bilaterally but slightly ataxic. No pronator drift.  DTRs:  2+ throughout.  Gait- deferred.  NIHSS:  1a Level of Consciousness: 0 1b LOC Questions: 0 1c LOC Commands: 0 2 Best Gaze: 0 3 Visual: 0 4 Facial Palsy: 0 5a Motor Arm - left: 0 5b Motor Arm - Right: 0 6a Motor Leg - Left: 0 6b Motor Leg - Right: 0 7 Limb Ataxia: 2 8 Sensory: 0 9 Best Language: 0 10 Dysarthria: 0 11 Extinction and Inattention: 0 TOTAL: 2  Labs I have reviewed labs in epic and the results pertinent to this consultation are: INR 1.0.   aPTT 25.  Creat 0.70.   CBC    Component Value Date/Time   WBC 4.1 11/14/2020 0919   RBC 4.60 11/14/2020 0919   HGB 13.9 11/14/2020 1001   HCT 41.0 11/14/2020 1001   PLT 227 11/14/2020 0919   MCV 92.8 11/14/2020 0919   MCH 29.6 11/14/2020 0919   MCHC 31.9 11/14/2020 0919   RDW 12.9 11/14/2020 0919   LYMPHSABS 0.9 11/14/2020 0919   MONOABS 0.6 11/14/2020 0919   EOSABS 0.1 11/14/2020 0919   BASOSABS 0.1 11/14/2020 0919    CMP     Component Value Date/Time   NA 141 11/14/2020 1001   K 4.4 11/14/2020 1001   CL 107 11/14/2020 1001   CO2 31 11/14/2020 0919   GLUCOSE 106 (H) 11/14/2020 1001   BUN 12 11/14/2020 1001   CREATININE 0.70 11/14/2020 1001   CALCIUM 9.4 11/14/2020 0919   PROT 6.4 (L) 11/14/2020 0919   ALBUMIN 3.8  11/14/2020 0919   AST 24 11/14/2020 0919   ALT 24 11/14/2020 0919   ALKPHOS 80 11/14/2020 0919   BILITOT 0.4 11/14/2020 0919   GFRNONAA >60 11/14/2020 0919   GFRAA >60 07/19/2015 0351    Imaging  CT head No acute finding. Chronic small vessel ischemia in the cerebral white matter.  MRI brain 6 mm focus of restricted diffusion and T2/FLAIR hyperintense signal abnormality within the medial left thalamus (without  corresponding abnormal enhancement), most consistent with acute infarct.  Moderate multifocal T2/FLAIR hyperintensity within the cerebral white matter. These foci have a nonspecific imaging appearance, but may reflect sequela of demyelinating disease given the provided history of multiple sclerosis. However, a component of chronic small vessel ischemic disease is also suspected.  Mild generalized parenchymal atrophy.   Assessment: 70 yo female with stroke risk factors of HLD and chronic small vessel ischemic disease. Complaints of gait disturbance and diplopia since 0500 today. Outside window for tPA. Little suspicion for LVO so no code stroke was called. Her falling to the right side is seen in L thalamic stroke. Diplopia is also associated with thalamic stroke.   Impression: -left thalamic stroke. -HLD.  -MS, stable.   Recommendations/Plan:  -medicine admit.  -frequent neuro checks.  -follow NIHSS.  -check HbA1c, fasting lipid panel and TSH.  -Increase home Atorvastatin if LDL over 70.  -infectious workup with UA.  -TTE.  -Continue ASA 81mg  po qd x 21 days, then ASA alone.   -Plavix 75mg  po qd x 21 days.  - Risk factor modification - cardiac telemetry monitoring for arrhythmia - PT consult, OT consult, Speech consult - stroke education - Stroke team to follow   Pt seen by , NP/Neuro and later by MD. Note/plan to be edited by MD as needed.  Pager:  I have seen the patient and reviewed the above note.  She has a small thalamic infarct, likely small vessel in etiology.  Without enhancement, and also given the location, I think that demyelinating disease is much less likely.  She currently takes Ocrevus and therefore is not due for any type of MS therapy while admitted.  She will need a work-up for secondary risk modification for small vessel risk factors.  She will also need evaluation by PT, OT.  Jimmye Norman, MD [facility] [phone]  If 7pm- 7am, please page neurology on call as listed in AMION.

## 2020-11-14 NOTE — ED Notes (Signed)
Hospitalitis at the patient bedside, update given.

## 2020-11-14 NOTE — H&P (Signed)
History and Physical    Barbara Singleton KZS:[id number] DOB: [birth year] DOA: 11/14/2020  PCP: Blair Heys, MD (Confirm with patient/family/NH records and if not entered, this has to be entered at [facility] point of entry) Patient coming from: [facility]  I have personally briefly reviewed patient's old medical records in [facility]  Chief Complaint: Double vision, trouble to move right leg  HPI: Barbara Singleton is a 70 y.o. female with medical history significant of MS in remission, HLD, DJD, anxiety/depression, presented with new onset of double vision and right-sided weakness.  Patient woke up this morning around 5 AM, with onset of double vision and right-sided weakness particularly right leg.  Had near fall episode.  Denies any numbness on any of the limbs.  Symptoms has been persistent.  No headache, no hearing changes, no chest pain or shortness of breath.  At baseline, patient has multiple sclerosis in remission, has been stable on Copaxone which was switched to current every 6 months monoclonal antibody therapy since last year.  She has not had any MS flareup for many years, and the flareup symptoms she had was mainly weakness, but never vision problems.  ED Course: MRI showed acute left thalamus stroke.  Review of Systems: As per HPI otherwise 14 point review of systems negative.    Past Medical History:  Diagnosis Date   Arthritis    Osteoarthritis- knees.   Depression    Multiple sclerosis (HCC)    Neuromuscular disorder [facility]    Dr. Algis Downs Jeffrey,neurology- [place], neurology fool[place] last visit 3 montha ago.    Past Surgical History:  Procedure Laterality Date   BACK SURGERY     fusions x2 lumbar.   BREAST EXCISIONAL BIOPSY Left    CESAREAN SECTION     DENTAL SURGERY     extractions   TONSILLECTOMY     TOTAL KNEE ARTHROPLASTY Right 07/18/2015   Procedure: TOTAL KNEE ARTHROPLASTY;  Surgeon: Durene Romans, MD;  Location: [facility];  Service: Orthopedics;   Laterality: Right;   TUBAL LIGATION       reports that she has quit smoking. Her smoking use included cigarettes. She does not have any smokeless tobacco history on file. She reports that she does not drink alcohol and does not use drugs.  No Known Allergies  Family History  Problem Relation Age of Onset   CVA Mother    Brain cancer Father      Prior to Admission medications   Medication Sig Start Date End Date Taking? Authorizing Provider  atorvastatin (LIPITOR) 10 MG tablet Take 10 mg by mouth daily. 11/06/20  Yes [provider]  Calcium Citrate-Vitamin D (CALCIUM + D PO) Take 1 tablet by mouth daily.   Yes [provider]  Cyanocobalamin (VITAMIN B 12 PO) Take 1 tablet by mouth daily.   Yes [provider]  fluvoxaMINE (LUVOX) 100 MG tablet Take 100 mg by mouth 3 (three) times daily.    Yes [provider]  lamoTRIgine (LAMICTAL) 100 MG tablet Take 100 mg by mouth 2 (two) times daily.    Yes [provider]  LATUDA 80 MG TABS tablet Take 80 mg by mouth daily. 10/24/20  Yes [provider]  LORazepam (ATIVAN) 0.5 MG tablet Take 0.5 mg by mouth daily as needed for anxiety. 10/17/20  Yes [provider]  methylphenidate (RITALIN) 20 MG tablet Take 20 mg by mouth daily. 11/06/20  Yes [provider]  Multiple Vitamin (MULTIVITAMIN WITH MINERALS) TABS tablet  Take 1 tablet by mouth daily.   Yes [provider]  traZODone (DESYREL) 100 MG tablet Take 100 mg by mouth at bedtime. 09/24/20  Yes [provider]  vitamin C (ASCORBIC ACID) 500 MG tablet Take 500 mg by mouth daily.   Yes [provider]  acetaminophen (TYLENOL) 500 MG tablet Take 2 tablets (1,000 mg total) by mouth every 8 (eight) hours as needed. Patient not taking: No sig reported 07/19/15   Lanney GinsBabish, Matthew, PA-C  celecoxib (CELEBREX) 200 MG capsule Take 1 capsule (200 mg total) by mouth every 12 (twelve) hours. Patient not taking: No  sig reported 07/19/15   Lanney GinsBabish, Matthew, PA-C  docusate sodium (COLACE) 100 MG capsule Take 1 capsule (100 mg total) by mouth 2 (two) times daily. Patient not taking: No sig reported 07/19/15   Lanney GinsBabish, Matthew, PA-C  ferrous sulfate 325 (65 FE) MG tablet Take 1 tablet (325 mg total) by mouth 3 (three) times daily after meals. Patient not taking: No sig reported 07/19/15   Lanney GinsBabish, Matthew, PA-C  methocarbamol (ROBAXIN) 500 MG tablet Take 1 tablet (500 mg total) by mouth every 6 (six) hours as needed for muscle spasms. Patient not taking: No sig reported 07/19/15   Lanney GinsBabish, Matthew, PA-C  oxyCODONE (OXY IR/ROXICODONE) 5 MG immediate release tablet Take 1-3 tablets (5-15 mg total) by mouth every 4 (four) hours as needed for moderate pain or severe pain. Patient not taking: No sig reported 07/19/15   Lanney GinsBabish, Matthew, PA-C  polyethylene glycol (MIRALAX / GLYCOLAX) packet Take 17 g by mouth 2 (two) times daily. Patient not taking: No sig reported 07/19/15   Lanney GinsBabish, Matthew, PA-C    Physical Exam: Vitals:   11/14/20 1100 11/14/20 1115 11/14/20 1130 11/14/20 1433  BP: (!) 155/86 (!) 150/88 (!) 159/83 (!) 168/81  Pulse: (!) 49 (!) 49 (!) 52 63  Resp: 16 13 14  (!) 21  Temp:      SpO2: 99% 98% 100% 97%    Constitutional: NAD, calm, comfortable Vitals:   11/14/20 1100 11/14/20 1115 11/14/20 1130 11/14/20 1433  BP: (!) 155/86 (!) 150/88 (!) 159/83 (!) 168/81  Pulse: (!) 49 (!) 49 (!) 52 63  Resp: 16 13 14  (!) 21  Temp:      SpO2: 99% 98% 100% 97%   Eyes: PERRL, lids and conjunctivae normal.  Double vision ENMT: Mucous membranes are moist. Posterior pharynx clear of any exudate or lesions.Normal dentition.  Neck: normal, supple, no masses, no thyromegaly Respiratory: clear to auscultation bilaterally, no wheezing, no crackles. Normal respiratory effort. No accessory muscle use.  Cardiovascular: Regular rate and rhythm, no murmurs / rubs / gallops. No extremity edema. 2+ pedal pulses. No carotid bruits.   Abdomen: no tenderness, no masses palpated. No hepatosplenomegaly. Bowel sounds positive.  Musculoskeletal: no clubbing / cyanosis. No joint deformity upper and lower extremities. Good ROM, no contractures. Normal muscle tone.  Skin: no rashes, lesions, ulcers. No induration Neurologic: CN 2-12 grossly intact. Sensation intact, DTR normal. Strength 5/5 in all 4.  Heel-knee-shin positive right> on the left with a zig-zag like movement Psychiatric: Normal judgment and insight. Alert and oriented x 3. Normal mood.    Labs on Admission: I have personally reviewed following labs and imaging studies  CBC: Recent Labs  Lab 11/14/20 0919 11/14/20 1001  WBC 4.1  --   NEUTROABS 2.4  --   HGB 13.6 13.9  HCT 42.7 41.0  MCV 92.8  --   PLT 227  --  Basic Metabolic Panel: Recent Labs  Lab 11/14/20 0919 11/14/20 1001  NA 140 141  K 4.3 4.4  CL 106 107  CO2 31  --   GLUCOSE 107* 106*  BUN 12 12  CREATININE 0.75 0.70  CALCIUM 9.4  --    GFR: CrCl cannot be calculated (Unknown ideal weight.). Liver Function Tests: Recent Labs  Lab 11/14/20 0919  AST 24  ALT 24  ALKPHOS 80  BILITOT 0.4  PROT 6.4*  ALBUMIN 3.8   No results for input(s): LIPASE, AMYLASE in the last 168 hours. No results for input(s): AMMONIA in the last 168 hours. Coagulation Profile: Recent Labs  Lab 11/14/20 0919  INR 1.0   Cardiac Enzymes: No results for input(s): CKTOTAL, CKMB, CKMBINDEX, TROPONINI in the last 168 hours. BNP (last 3 results) No results for input(s): PROBNP in the last 8760 hours. HbA1C: No results for input(s): HGBA1C in the last 72 hours. CBG: No results for input(s): GLUCAP in the last 168 hours. Lipid Profile: No results for input(s): CHOL, HDL, LDLCALC, TRIG, CHOLHDL, LDLDIRECT in the last 72 hours. Thyroid Function Tests: No results for input(s): TSH, T4TOTAL, FREET4, T3FREE, THYROIDAB in the last 72 hours. Anemia Panel: No results for input(s): VITAMINB12, FOLATE,  FERRITIN, TIBC, IRON, RETICCTPCT in the last 72 hours. Urine analysis:    Component Value Date/Time   COLORURINE YELLOW 07/10/2015 1320   APPEARANCEUR CLOUDY (A) 07/10/2015 1320   LABSPEC 1.022 07/10/2015 1320   PHURINE 7.0 07/10/2015 1320   GLUCOSEU NEGATIVE 07/10/2015 1320   HGBUR NEGATIVE 07/10/2015 1320   BILIRUBINUR NEGATIVE 07/10/2015 1320   KETONESUR NEGATIVE 07/10/2015 1320   PROTEINUR NEGATIVE 07/10/2015 1320   NITRITE NEGATIVE 07/10/2015 1320   LEUKOCYTESUR NEGATIVE 07/10/2015 1320    Radiological Exams on Admission: CT HEAD WO CONTRAST  Result Date: 11/14/2020 CLINICAL DATA:  Neuro deficit with acute stroke suspected. Right-sided weakness and unsteady gait. EXAM: CT HEAD WITHOUT CONTRAST TECHNIQUE: Contiguous axial images were obtained from the base of the skull through the vertex without intravenous contrast. COMPARISON:  None. FINDINGS: Brain: No evidence of acute cortical infarction, hemorrhage, hydrocephalus, extra-axial collection or mass lesion/mass effect. Notable streak artifact over the pons. Patchy low-density in the cerebral white matter attributed to chronic small vessel ischemia. Age normal brain volume. Vascular: No hyperdense vessel or unexpected calcification. Skull: Normal. Negative for fracture or focal lesion. Sinuses/Orbits: No acute finding. IMPRESSION: 1. No acute finding. 2. Chronic small vessel ischemia in the cerebral white matter. Electronically Signed   By: Marnee Spring M.D.   On: 11/14/2020 09:51   MR Brain W and Wo Contrast  Result Date: 11/14/2020 CLINICAL DATA:  Neuro deficit, acute, stroke suspected. Double vision, ataxia, history of MS, concern for stroke or MS flare. Patient reports right leg is weaker. L vision in right eye. Headache. EXAM: MRI HEAD WITHOUT AND WITH CONTRAST TECHNIQUE: Multiplanar, multiecho pulse sequences of the brain and surrounding structures were obtained without and with intravenous contrast. CONTRAST:  75mL GADAVIST  GADOBUTROL 1 MMOL/ML IV SOLN COMPARISON:  Noncontrast head CT 11/14/2020. FINDINGS: Brain: Mild generalized cerebral and cerebellar atrophy. 6 mm focus of restricted diffusion and T2/FLAIR hyperintense signal abnormality within the medial left thalamus (series 3, image 29). There is no corresponding abnormal enhancement and this likely reflects an acute infarct. Moderate multifocal T2/FLAIR hyperintensity within the cerebral white matter, nonspecific. No evidence of an intracranial mass. No chronic intracranial blood products. No extra-axial fluid collection. No midline shift. No abnormal intracranial enhancement. Vascular: Expected  proximal arterial flow voids. Skull and upper cervical spine: No focal marrow lesion. Sinuses/Orbits: Visualized orbits show no acute finding. Mild-to-moderate mucosal thickening within the right maxillary sinus. Other: Trace fluid within the bilateral mastoid air cells. IMPRESSION: 6 mm focus of restricted diffusion and T2/FLAIR hyperintense signal abnormality within the medial left thalamus (without corresponding abnormal enhancement), most consistent with acute infarct. Moderate multifocal T2/FLAIR hyperintensity within the cerebral white matter. These foci have a nonspecific imaging appearance, but may reflect sequela of demyelinating disease given the provided history of multiple sclerosis. However, a component of chronic small vessel ischemic disease is also suspected. Mild generalized parenchymal atrophy. Right maxillary sinus disease, as described. Trace fluid within the bilateral mastoid air cells. Electronically Signed   By: Jackey Loge DO   On: 11/14/2020 12:40    EKG: Independently reviewed. Sinus bradycardia  Assessment/Plan Active Problems:   Stroke (cerebrum) (HCC)   Stroke (HCC)  (please populate well all problems here in Problem List. (For example, if patient is on BP meds at home and you resume or decide to hold them, it is a problem that needs to be her. Same  for CAD, COPD, HLD and so on)  Acute left thalamus stroke -Double vision and right-sided coordination impairment leg>arm -Continue aspirin and statin, check lipid panel and A1c. -Ordered CT angiogram head and neck, echocardiogram telemetry monitoring -Likely will need rehab.  PT evaluation and consult neurology.  Elevated blood pressure without diagnosis of HTN -Allow permissive hypertension x48 hours.  As needed hydralazine.  HLD -Continue statin, check lipid panel  MS -Stable, on Ocrelizumab every 6 months  Anxiety/depression -Continue SSRI and lamotrigine  DVT prophylaxis: Lovenox Code Status: Full Code Family Communication: Husband at bedside Disposition Plan: Expect more than 2 midnight hospital stay Consults called: Neuro Admission status: Tele admit   Emeline General MD [facility] Pager [phone]  11/14/2020, 2:48 PM

## 2020-11-14 NOTE — ED Provider Notes (Signed)
Emergency Medicine Provider Triage Evaluation Note  Barbara Singleton 70 y.o. female was evaluated in triage.  Pt complains of double vision and is in the right lower extremity.  She reports that this started this morning when she woke up at 5 AM.  When she went to sleep last night, she states she was in her normal state of health.  When she woke up this morning, she has not been able to walk which she states is her.  When she is at home, she can walk independently but when she is out she is using a cane.  She feels like her right leg is weaker.  She also reports double vision in right eye.  No vision loss.  She states she does have a headache.  She is on a blood thinners.  No fall, trauma, injury.  She has not noticed any weakness of her right upper extremity.  No chest pain, difficulty breathing, abdominal pain.   Review of Systems  Positive: Vision disturbance, right lower extremity weakness Negative: Chest pain, difficulty breathing, abdominal pain.  Physical Exam  BP 134/82   Pulse 70   Temp 98.2 F (36.8 C) (Oral)   Resp 18   Ht 5\' 4"  (1.626 m)   Wt 65.8 kg   SpO2 100%   BMI 24.89 kg/m  Gen:   Awake, no distress   HEENT:  Atraumatic. PERRL Resp:  Normal effort  Cardiac:  Normal rate  Abd:   Nondistended, nontender  MSK:   Moves extremities without difficulty  Neuro:  Speech clear.  No facial droop.  Slight diminished grip strength of right upper extremity.  She can lift right lower extremity but I do appreciate a minimal amount of weakness.  Other:      Medical Decision Making  Medically screening exam initiated at 9:17 AM  Appropriate orders placed.  Khristy A Copelan was informed that the remainder of the evaluation will be completed by another provider, this initial triage assessment does not replace that evaluation. They are counseled that they will need to remain in the ED until the completion of their workup, including full H&P and results of any tests.  Risks of leaving the  emergency department prior to completion of treatment were discussed. Patient was advised to inform ED staff if they are leaving before their treatment is complete. The patient acknowledged these risks and time was allowed for questions.     The patient appears stable so that the remainder of the MSE may be completed by another provider.  Updated triage RN that patient needed eval in the main ED.  At this time, given unclear last known normal and that patient woke up with the symptoms, code stroke not initiated given that she is outside for 4-hour window.  Clinical Impression  Vision change, difficulty walking   Portions of this note were generated with Dragon dictation software. Dictation errors may occur despite best attempts at proofreading.     Myriam Jacobson, PA-C 11/14/20 1020    01/15/21, MD 11/15/20 [phone]

## 2020-11-15 ENCOUNTER — Inpatient Hospital Stay (HOSPITAL_BASED_OUTPATIENT_CLINIC_OR_DEPARTMENT_OTHER): Payer: Medicare PPO

## 2020-11-15 ENCOUNTER — Encounter (HOSPITAL_COMMUNITY): Payer: Self-pay | Admitting: Internal Medicine

## 2020-11-15 DIAGNOSIS — Z20822 Contact with and (suspected) exposure to covid-19: Secondary | ICD-10-CM | POA: Diagnosis not present

## 2020-11-15 DIAGNOSIS — I6389 Other cerebral infarction: Secondary | ICD-10-CM

## 2020-11-15 DIAGNOSIS — Z79899 Other long term (current) drug therapy: Secondary | ICD-10-CM | POA: Diagnosis not present

## 2020-11-15 DIAGNOSIS — G35 Multiple sclerosis: Secondary | ICD-10-CM | POA: Diagnosis not present

## 2020-11-15 DIAGNOSIS — R262 Difficulty in walking, not elsewhere classified: Secondary | ICD-10-CM | POA: Diagnosis not present

## 2020-11-15 DIAGNOSIS — Z87891 Personal history of nicotine dependence: Secondary | ICD-10-CM | POA: Diagnosis not present

## 2020-11-15 DIAGNOSIS — I10 Essential (primary) hypertension: Secondary | ICD-10-CM | POA: Diagnosis not present

## 2020-11-15 DIAGNOSIS — I639 Cerebral infarction, unspecified: Secondary | ICD-10-CM | POA: Diagnosis not present

## 2020-11-15 DIAGNOSIS — H538 Other visual disturbances: Secondary | ICD-10-CM | POA: Diagnosis not present

## 2020-11-15 LAB — ECHOCARDIOGRAM COMPLETE
Area-P 1/2: 2.32 cm2
Height: 60 in
S' Lateral: 2.6 cm
Weight: 2712.54 oz

## 2020-11-15 LAB — LIPID PANEL
Cholesterol: 187 mg/dL (ref 0–200)
HDL: 75 mg/dL (ref >40)
LDL Cholesterol: 90 mg/dL (ref 0–99)
Total CHOL/HDL Ratio: 2.5 RATIO
Triglycerides: 109 mg/dL (ref <150)
VLDL: 22 mg/dL (ref 0–40)

## 2020-11-15 LAB — HIV ANTIBODY (ROUTINE TESTING W REFLEX): HIV Screen 4th Generation wRfx: NONREACTIVE

## 2020-11-15 LAB — HEMOGLOBIN A1C
Hgb A1c MFr Bld: 5.9 % — ABNORMAL HIGH (ref 4.8–5.6)
Mean Plasma Glucose: 122.63 mg/dL

## 2020-11-15 LAB — TSH: TSH: 2.546 u[IU]/mL (ref 0.350–4.500)

## 2020-11-15 MED ORDER — ATORVASTATIN CALCIUM 40 MG PO TABS
40.0000 mg | ORAL_TABLET | Freq: Every day | ORAL | Status: DC
  Administered 2020-11-16: 40 mg via ORAL
  Filled 2020-11-15: qty 1

## 2020-11-15 NOTE — Progress Notes (Signed)
PROGRESS NOTE    ELLIE BRYAND  OJJ:[id number] DOB: Feb 16, 1951 DOA: 11/14/2020 PCP: Blair Heys, MD  Brief Narrative:  70 year old female with history of MS in remission, dyslipidemia, DJD, anxiety, depression presented to the ED with new onset double vision and difficulty ambulating Assessment & Plan:   Acute left thalamic infarct -Symptoms improving -MRI noted 6 mm focus of restricted diffusion in the medial left thalamus -Likely secondary to small vessel disease -CTA head and neck without large vessel occlusion -2D echocardiogram is pending -LDL is 90, hemoglobin A1c is 5.9 -PT OT, SLP evaluations -Neurology following, on aspirin 81 mg prior to admission, now on aspirin 81 mg along with Plavix 75 mg daily for the 3 weeks followed by Plavix alone  Hypertension -Stable, progressive hypertension  Dyslipidemia -Lipitor increased to 40 mg LDL was 90  Borderline diabetes -Hemoglobin A1c is 5.9  Multiple sclerosis -Stable, on ocrelizumab every 6   DVT prophylaxis: Lovenox Code Status: Full code Family Communication: No family at bedside Disposition Plan:  Status is: Inpatient  Remains inpatient appropriate because:Inpatient level of care appropriate due to severity of illness  Dispo: The patient is from: Home              Anticipated d/c is to: Home              Patient currently is not medically stable to d/c.   Difficult to place patient No        Consultants:  Neurology  Procedures:   Antimicrobials:    Subjective: -Feels better today, double vision has resolved, has not ambulated yet  Objective: Vitals:   11/15/20 0250 11/15/20 0421 11/15/20 1249 11/15/20 1249  BP: 128/78 112/72 102/64 102/64  Pulse: 60 (!) 52 65 64  Resp: 14 16 16 16   Temp: 98.4 F (36.9 C) 98.2 F (36.8 C) 98.9 F (37.2 C) 98.9 F (37.2 C)  TempSrc: Oral Oral Oral Oral  SpO2: 94% 92% 96% 96%  Weight:      Height:        Intake/Output Summary (Last 24 hours) at  11/15/2020 1429 Last data filed at 11/15/2020 01/16/2021 Gross per 24 hour  Intake --  Output 250 ml  Net -250 ml   Filed Weights   11/14/20 2046  Weight: 76.9 kg    Examination:  General exam: AAOx3, no distress Respiratory system: Clear to auscultation Cardiovascular system: S1 & S2 heard, RRR.  Abd: nondistended, soft and nontender.Normal bowel sounds heard. Central nervous system: Alert and oriented. No focal neurological deficits. Extremities: No edema Skin: No rashes Psychiatry: Flat affect    Data Reviewed:   CBC: Recent Labs  Lab 11/14/20 0919 11/14/20 1001  WBC 4.1  --   NEUTROABS 2.4  --   HGB 13.6 13.9  HCT 42.7 41.0  MCV 92.8  --   PLT 227  --    Basic Metabolic Panel: Recent Labs  Lab 11/14/20 0919 11/14/20 1001  NA 140 141  K 4.3 4.4  CL 106 107  CO2 31  --   GLUCOSE 107* 106*  BUN 12 12  CREATININE 0.75 0.70  CALCIUM 9.4  --    GFR: Estimated Creatinine Clearance: 60.9 mL/min (by C-G formula based on SCr of 0.7 mg/dL). Liver Function Tests: Recent Labs  Lab 11/14/20 0919  AST 24  ALT 24  ALKPHOS 80  BILITOT 0.4  PROT 6.4*  ALBUMIN 3.8   No results for input(s): LIPASE, AMYLASE in the last 168 hours. No results  for input(s): AMMONIA in the last 168 hours. Coagulation Profile: Recent Labs  Lab 11/14/20 0919  INR 1.0   Cardiac Enzymes: No results for input(s): CKTOTAL, CKMB, CKMBINDEX, TROPONINI in the last 168 hours. BNP (last 3 results) No results for input(s): PROBNP in the last 8760 hours. HbA1C: Recent Labs    11/15/20 0410  HGBA1C 5.9*   CBG: No results for input(s): GLUCAP in the last 168 hours. Lipid Profile: Recent Labs    11/15/20 0410  CHOL 187  HDL 75  LDLCALC 90  TRIG 109  CHOLHDL 2.5   Thyroid Function Tests: Recent Labs    11/15/20 0410  TSH 2.546   Anemia Panel: No results for input(s): VITAMINB12, FOLATE, FERRITIN, TIBC, IRON, RETICCTPCT in the last 72 hours. Urine analysis:    Component Value  Date/Time   COLORURINE YELLOW 11/14/2020 2301   APPEARANCEUR CLOUDY (A) 11/14/2020 2301   LABSPEC >1.046 (H) 11/14/2020 2301   PHURINE 7.0 11/14/2020 2301   GLUCOSEU NEGATIVE 11/14/2020 2301   HGBUR NEGATIVE 11/14/2020 2301   BILIRUBINUR NEGATIVE 11/14/2020 2301   KETONESUR NEGATIVE 11/14/2020 2301   PROTEINUR NEGATIVE 11/14/2020 2301   NITRITE NEGATIVE 11/14/2020 2301   LEUKOCYTESUR NEGATIVE 11/14/2020 2301   Sepsis Labs: @LABRCNTIP (procalcitonin:4,lacticidven:4)  ) Recent Results (from the past 240 hour(s))  SARS CORONAVIRUS 2 (TAT 6-24 HRS) Nasopharyngeal Nasopharyngeal Swab     Status: None   Collection Time: 11/14/20  4:26 PM   Specimen: Nasopharyngeal Swab  Result Value Ref Range Status   SARS Coronavirus 2 NEGATIVE NEGATIVE Final    Comment: (NOTE) SARS-CoV-2 target nucleic acids are NOT DETECTED.  The SARS-CoV-2 RNA is generally detectable in upper and lower respiratory specimens during the acute phase of infection. Negative results do not preclude SARS-CoV-2 infection, do not rule out co-infections with other pathogens, and should not be used as the sole basis for treatment or other patient management decisions. Negative results must be combined with clinical observations, patient history, and epidemiological information. The expected result is Negative.  Fact Sheet for Patients: HairSlick.no  Fact Sheet for Healthcare Providers: quierodirigir.com  This test is not yet approved or cleared by the [place] FDA and  has been authorized for detection and/or diagnosis of SARS-CoV-2 by FDA under an Emergency Use Authorization (EUA). This EUA will remain  in effect (meaning this test can be used) for the duration of the COVID-19 declaration under Se ction 564(b)(1) of the Act, 21 U.S.C. section 360bbb-3(b)(1), unless the authorization is terminated or revoked sooner.  Performed at [facility],  [address], [place], [place] [address]          Radiology Studies: CT ANGIO HEAD W OR WO CONTRAST  Result Date: 11/14/2020 CLINICAL DATA:  Stroke/TIA EXAM: CT ANGIOGRAPHY HEAD AND NECK TECHNIQUE: Multidetector CT imaging of the head and neck was performed using the standard protocol during bolus administration of intravenous contrast. Multiplanar CT image reconstructions and MIPs were obtained to evaluate the vascular anatomy. Carotid stenosis measurements (when applicable) are obtained utilizing NASCET criteria, using the distal internal carotid diameter as the denominator. CONTRAST:  61mL OMNIPAQUE IOHEXOL 350 MG/ML SOLN COMPARISON:  Same day CT head and MRI. FINDINGS: CTA NECK FINDINGS Aortic arch: Great vessel origins are patent. Right carotid system: No evidence of dissection, stenosis (50% or greater) or occlusion. Left carotid system: No evidence of dissection, stenosis (50% or greater) or occlusion. Vertebral arteries: Codominant. No evidence of dissection, stenosis (50% or greater) or occlusion. Skeleton: Slight retrolisthesis  of C3 on C4. No evidence of acute fracture on limited assessment. Multilevel facet arthropathy. Other neck: No acute abnormality. Upper chest: Mild peripheral predominant subpleural reticulation in the lung apices bilaterally, potentially representing mild fibrosis/scarring. No consolidation. Review of the MIP images confirms the above findings CTA HEAD FINDINGS Anterior circulation: No large vessel occlusion or proximal flow limiting stenosis. Hypoplastic left A1 ACA. No aneurysm identified. Posterior circulation: Small vertebrobasilar system with bilateral fetal type PCAs, anatomic variant. This includes small bilateral vertebral arteries and a small basilar artery throughout. Bilateral posterior cerebral arteries are patent without proximal flow limiting stenosis. No aneurysm identified. Venous sinuses: As permitted by contrast timing, patent. Anatomic variants: As  detailed above. Review of the MIP images confirms the above findings IMPRESSION: 1. No large vessel occlusion or proximal flow limiting stenosis in the head or neck. 2. Small vertebrobasilar system with bilateral fetal type PCAs, anatomic variant Electronically Signed   By: Feliberto Harts MD   On: 11/14/2020 16:26   CT HEAD WO CONTRAST  Result Date: 11/14/2020 CLINICAL DATA:  Neuro deficit with acute stroke suspected. Right-sided weakness and unsteady gait. EXAM: CT HEAD WITHOUT CONTRAST TECHNIQUE: Contiguous axial images were obtained from the base of the skull through the vertex without intravenous contrast. COMPARISON:  None. FINDINGS: Brain: No evidence of acute cortical infarction, hemorrhage, hydrocephalus, extra-axial collection or mass lesion/mass effect. Notable streak artifact over the pons. Patchy low-density in the cerebral white matter attributed to chronic small vessel ischemia. Age normal brain volume. Vascular: No hyperdense vessel or unexpected calcification. Skull: Normal. Negative for fracture or focal lesion. Sinuses/Orbits: No acute finding. IMPRESSION: 1. No acute finding. 2. Chronic small vessel ischemia in the cerebral white matter. Electronically Signed   By: Marnee Spring M.D.   On: 11/14/2020 09:51   CT ANGIO NECK W OR WO CONTRAST  Result Date: 11/14/2020 CLINICAL DATA:  Stroke/TIA EXAM: CT ANGIOGRAPHY HEAD AND NECK TECHNIQUE: Multidetector CT imaging of the head and neck was performed using the standard protocol during bolus administration of intravenous contrast. Multiplanar CT image reconstructions and MIPs were obtained to evaluate the vascular anatomy. Carotid stenosis measurements (when applicable) are obtained utilizing NASCET criteria, using the distal internal carotid diameter as the denominator. CONTRAST:  94mL OMNIPAQUE IOHEXOL 350 MG/ML SOLN COMPARISON:  Same day CT head and MRI. FINDINGS: CTA NECK FINDINGS Aortic arch: Great vessel origins are patent. Right carotid  system: No evidence of dissection, stenosis (50% or greater) or occlusion. Left carotid system: No evidence of dissection, stenosis (50% or greater) or occlusion. Vertebral arteries: Codominant. No evidence of dissection, stenosis (50% or greater) or occlusion. Skeleton: Slight retrolisthesis of C3 on C4. No evidence of acute fracture on limited assessment. Multilevel facet arthropathy. Other neck: No acute abnormality. Upper chest: Mild peripheral predominant subpleural reticulation in the lung apices bilaterally, potentially representing mild fibrosis/scarring. No consolidation. Review of the MIP images confirms the above findings CTA HEAD FINDINGS Anterior circulation: No large vessel occlusion or proximal flow limiting stenosis. Hypoplastic left A1 ACA. No aneurysm identified. Posterior circulation: Small vertebrobasilar system with bilateral fetal type PCAs, anatomic variant. This includes small bilateral vertebral arteries and a small basilar artery throughout. Bilateral posterior cerebral arteries are patent without proximal flow limiting stenosis. No aneurysm identified. Venous sinuses: As permitted by contrast timing, patent. Anatomic variants: As detailed above. Review of the MIP images confirms the above findings IMPRESSION: 1. No large vessel occlusion or proximal flow limiting stenosis in the head or neck. 2. Small vertebrobasilar  system with bilateral fetal type PCAs, anatomic variant Electronically Signed   By: Feliberto Harts MD   On: 11/14/2020 16:26   MR Brain W and Wo Contrast  Result Date: 11/14/2020 CLINICAL DATA:  Neuro deficit, acute, stroke suspected. Double vision, ataxia, history of MS, concern for stroke or MS flare. Patient reports right leg is weaker. L vision in right eye. Headache. EXAM: MRI HEAD WITHOUT AND WITH CONTRAST TECHNIQUE: Multiplanar, multiecho pulse sequences of the brain and surrounding structures were obtained without and with intravenous contrast. CONTRAST:  41mL  GADAVIST GADOBUTROL 1 MMOL/ML IV SOLN COMPARISON:  Noncontrast head CT 11/14/2020. FINDINGS: Brain: Mild generalized cerebral and cerebellar atrophy. 6 mm focus of restricted diffusion and T2/FLAIR hyperintense signal abnormality within the medial left thalamus (series 3, image 29). There is no corresponding abnormal enhancement and this likely reflects an acute infarct. Moderate multifocal T2/FLAIR hyperintensity within the cerebral white matter, nonspecific. No evidence of an intracranial mass. No chronic intracranial blood products. No extra-axial fluid collection. No midline shift. No abnormal intracranial enhancement. Vascular: Expected proximal arterial flow voids. Skull and upper cervical spine: No focal marrow lesion. Sinuses/Orbits: Visualized orbits show no acute finding. Mild-to-moderate mucosal thickening within the right maxillary sinus. Other: Trace fluid within the bilateral mastoid air cells. IMPRESSION: 6 mm focus of restricted diffusion and T2/FLAIR hyperintense signal abnormality within the medial left thalamus (without corresponding abnormal enhancement), most consistent with acute infarct. Moderate multifocal T2/FLAIR hyperintensity within the cerebral white matter. These foci have a nonspecific imaging appearance, but may reflect sequela of demyelinating disease given the provided history of multiple sclerosis. However, a component of chronic small vessel ischemic disease is also suspected. Mild generalized parenchymal atrophy. Right maxillary sinus disease, as described. Trace fluid within the bilateral mastoid air cells. Electronically Signed   By: Jackey Loge DO   On: 11/14/2020 12:40        Scheduled Meds:  vitamin C  500 mg Oral Daily   aspirin EC  81 mg Oral Daily   [START ON 11/16/2020] atorvastatin  40 mg Oral Daily   calcium-vitamin D  1 tablet Oral Daily   clopidogrel  75 mg Oral Daily   enoxaparin (LOVENOX) injection  40 mg Subcutaneous Q24H   fluvoxaMINE  100 mg Oral TID    lamoTRIgine  100 mg Oral BID   lurasidone  80 mg Oral Daily   methylphenidate  20 mg Oral Daily   multivitamin with minerals  1 tablet Oral Daily   sodium chloride flush  3 mL Intravenous Once   traZODone  100 mg Oral QHS   vitamin B-12  100 mcg Oral Daily   Continuous Infusions:   LOS: 1 day    Time spent:  Zannie Cove, MD [facility]   11/15/2020, 2:29 PM

## 2020-11-15 NOTE — Care Management CC44 (Signed)
Condition Code 44 Documentation Completed  Patient Details  Name: Barbara Singleton MRN: [id number] Date of Birth: 1950-07-06   Condition Code 44 given:  Yes Patient signature on Condition Code 44 notice:  Yes Documentation of 2 MD's agreement:    Code 44 added to claim:  Yes    Michel Bickers, RN 11/15/2020, 7:04 PM

## 2020-11-15 NOTE — Progress Notes (Signed)
  Echocardiogram 2D Echocardiogram has been performed.  Barbara Singleton Barbara Singleton 11/15/2020, 11:20 AM

## 2020-11-15 NOTE — Evaluation (Signed)
Occupational Therapy Evaluation Patient Details Name: Barbara Singleton MRN: [id number] DOB: 12-05-1950 Today's Date: 11/15/2020    History of Present Illness Barbara Singleton is a 70 y.o. female who presented with new onset of double vision and right-sided weakness; MRI showed acute left thalamus stroke; with medical history significant of MS in remission, HLD, DJD, anxiety/depression    Clinical Impression  Patient evaluated by Occupational Therapy with no further acute OT needs identified. All education has been completed and the patient has no further questions. Pt with intact BUE function, coordination and sensation. No visual disturbances, and baseline ADL performance. Pt is showing some need of Min guard assist with ambulation while pt reports is baseline, but will defer to physical therapy for gait assessment. See below for any follow-up Occupational Therapy or equipment needs. OT is signing off. Thank you for this referral.     Follow Up Recommendations  No OT follow up    Equipment Recommendations  None recommended by OT    Recommendations for Other Services PT consult     Precautions / Restrictions Precautions Precautions: Fall Restrictions Weight Bearing Restrictions: No      Mobility Bed Mobility Overal bed mobility: Modified Independent             General bed mobility comments: HOB partially elevated.    Transfers Overall transfer level: Needs assistance Equipment used: 1 person hand held assist Transfers: Sit to/from [employer] Sit to Stand: Min guard Stand pivot transfers: Min guard            Balance Overall balance assessment: Needs assistance Sitting-balance support: No upper extremity supported;Feet unsupported Sitting balance-Leahy Scale: Good       Standing balance-Leahy Scale: Fair Standing balance comment: Fair due to intermittent need of unilateral UE support and scoliotic posture.                            ADL either performed or assessed with clinical judgement   ADL Overall ADL's : At baseline                                       General ADL Comments: Pt able to demonstrate LE dressing, toileting, grooming and in-room ambulation and bathroom functional mobility and low toilet tranfer at pt's reported baseline.  Pt required HHA of 1 when first OOB. Pt progressed to leaving bathroom without HHA or AD, one hand to foot of bed for furniture support, but otherwise unassisted. Baseline limp and stooped posture from baseline scoliosis.     Vision Baseline Vision/History: Wears glasses Wears Glasses: At all times Patient Visual Report: No change from baseline Additional Comments: Intact pursuits, intact finger to nose.     Perception     Praxis      Pertinent Vitals/Pain Pain Assessment: No/denies pain     Hand Dominance     Extremity/Trunk Assessment Upper Extremity Assessment Upper Extremity Assessment: Overall WFL for tasks assessed (BUEs with symmetrical AROM. Mild strength descrepancy with RT<LT but pt reports this is baseline due to MS.)   Lower Extremity Assessment Lower Extremity Assessment: Defer to PT evaluation   Cervical / Trunk Assessment Cervical / Trunk Assessment: Kyphotic (h/o scoliosis per pt)   Communication     Cognition Arousal/Alertness: Awake/alert Behavior During Therapy: WFL for tasks assessed/performed Overall Cognitive Status: Within Functional Limits for tasks assessed  General Comments: A&Ox4. No cognitive deficits noted.   General Comments       Exercises     Shoulder Instructions      Home Living Family/patient expects to be discharged to:: Private residence Living Arrangements: Spouse/significant other Available Help at Discharge: Family;Available 24 hours/day Type of Home: House Home Access: Stairs to enter [employer] of Steps: 5 Entrance Stairs-Rails:  Right;Left (Wide apart)       Bathroom Shower/Tub: Chief Strategy Officer: Standard (can push from vanity)     Home Equipment: Grab bars - tub/shower;Shower seat;Walker - 2 wheels;Cane - single point;Hand held shower head          Prior Functioning/Environment                   OT Problem List: Impaired balance (sitting and/or standing)      OT Treatment/Interventions:      OT Goals(Current goals can be found in the care plan section) Acute Rehab OT Goals Patient Stated Goal: Home OT Goal Formulation: With patient Potential to Achieve Goals: Good  OT Frequency:     Barriers to D/C:            Co-evaluation              AM-PAC OT "6 Clicks" Daily Activity     Outcome Measure Help from another person eating meals?: None Help from another person taking care of personal grooming?: None Help from another person toileting, which includes using toliet, bedpan, or urinal?: A Little Help from another person bathing (including washing, rinsing, drying)?: A Little Help from another person to put on and taking off regular upper body clothing?: None Help from another person to put on and taking off regular lower body clothing?: None 6 Click Score: 22   End of Session Equipment Utilized During Treatment: Gait belt  Activity Tolerance: Patient tolerated treatment well Patient left: in chair;with call bell/phone within reach;with chair alarm set  OT Visit Diagnosis: Unsteadiness on feet (R26.81)                Time: 9892-1194 OT Time Calculation (min): 29 min Charges:  OT General Charges $OT Visit: 1 Visit OT Evaluation $OT Eval Moderate Complexity: 1 Mod  Kshawn Canal, OT Acute Rehab Services Office: [phone] 11/15/2020  Theodoro Clock 11/15/2020, 9:46 AM

## 2020-11-15 NOTE — Evaluation (Signed)
Physical Therapy Evaluation Patient Details Name: Barbara Singleton MRN: [id number] DOB: [birth year] Today's Date: 11/15/2020   History of Present Illness  Barbara Singleton is a 70 y.o. female who presented with new onset of double vision and right-sided weakness; MRI showed acute left thalamus stroke; with medical history significant of MS in remission, HLD, DJD, anxiety/depression  Clinical Impression   Pt admitted with above diagnosis. Comes from home where she lives with her husband in a single level home with 4 steps to enter; Independent at baseline, often uses a cane when walking in public; Presents to PT with Mild RLE weakness, and gait assymetries which are her baseline with a history of scoliosis and MS; no reported history of falls;  Pt currently with functional limitations due to the deficits listed below (see PT Problem List). Pt will benefit from skilled PT to increase their independence and safety with mobility to allow discharge to the venue listed below.       Follow Up Recommendations Outpatient PT;Supervision - Intermittent;Other (comment) (Pt declines Outpt PT, but tells me she will consider it later)    Equipment Recommendations       Recommendations for Other Services       Precautions / Restrictions Precautions Precautions: Fall      Mobility  Bed Mobility                    Transfers Overall transfer level: Needs assistance Equipment used: None Transfers: Sit to/from Stand Sit to Stand: Min guard         General transfer comment: Dependent on UEs to push, but overall good rise and steady at initial stand  Ambulation/Gait Ambulation/Gait assistance: Min guard;Supervision Gait Distance (Feet): 120 Feet Assistive device: None Gait Pattern/deviations: Decreased step length - right;Decreased step length - left;Trunk flexed (Trunk forward and laterally flexed)     General Gait Details: Gait assymetries with scoliotic trunk forward and R laterally  flexed; leads to energetically taxing gait pattern; pt states this is her baseline gait pattern  Stairs            Wheelchair Mobility    Modified Rankin (Stroke Patients Only)       Balance     Sitting balance-Leahy Scale: Good       Standing balance-Leahy Scale: Fair Standing balance comment: Fair due to intermittent need of unilateral UE support and scoliotic posture.                             Pertinent Vitals/Pain Pain Assessment: No/denies pain (Simultaneous filing. User may not have seen previous data.)    Home Living Family/patient expects to be discharged to:: Private residence Living Arrangements: Spouse/significant other Available Help at Discharge: Family;Available 24 hours/day (Simultaneous filing. User may not have seen previous data.) Type of Home: House (Simultaneous filing. User may not have seen previous data.) Home Access: Stairs to enter Entrance Stairs-Rails: Right;Left (Wide apart) Entrance Stairs-Number of Steps: 5 Home Layout: One level Home Equipment: Grab bars - tub/shower;Shower seat;Walker - 2 wheels;Cane - single point;Hand held shower head      Prior Function Level of Independence: Independent         Comments: Uses a cane for amb when out and about in the community; gait assymetries at baseline wit history of scoliosis and MS     Hand Dominance   Dominant Hand: Right    Extremity/Trunk Assessment   Upper Extremity Assessment  Upper Extremity Assessment: Defer to OT evaluation    Lower Extremity Assessment Lower Extremity Assessment: RLE deficits/detail RLE Deficits / Details: Overall WFL; noted hip flexion 4/5, notably weaker than LLE    Cervical / Trunk Assessment Cervical / Trunk Assessment: Other exceptions (Scoliotic)  Communication      Cognition Arousal/Alertness: Awake/alert Behavior During Therapy: WFL for tasks assessed/performed Overall Cognitive Status: Within Functional Limits for tasks  assessed (Simultaneous filing. User may not have seen previous data.)                                 General Comments: A&Ox4. No cognitive deficits noted.      General Comments General comments (skin integrity, edema, etc.): We discussed potential benefit of outpt PT follow up for gait efficiency and balance; at this point, pt declines, but will consider later    Exercises     Assessment/Plan    PT Assessment Patient needs continued PT services  PT Problem List Decreased strength;Decreased range of motion;Decreased activity tolerance;Decreased balance;Decreased coordination       PT Treatment Interventions DME instruction;Gait training;Stair training;Functional mobility training;Therapeutic activities;Therapeutic exercise;Balance training;Patient/family education    PT Goals (Current goals can be found in the Care Plan section)  Acute Rehab PT Goals Patient Stated Goal: hopes to get home today PT Goal Formulation: With patient Time For Goal Achievement: 11/22/20 Potential to Achieve Goals: Good    Frequency Min 4X/week   Barriers to discharge        Co-evaluation               AM-PAC PT "6 Clicks" Mobility  Outcome Measure Help needed turning from your back to your side while in a flat bed without using bedrails?: None Help needed moving from lying on your back to sitting on the side of a flat bed without using bedrails?: None Help needed moving to and from a bed to a chair (including a wheelchair)?: None Help needed standing up from a chair using your arms (e.g., wheelchair or bedside chair)?: A Little Help needed to walk in hospital room?: None Help needed climbing 3-5 steps with a railing? : A Little 6 Click Score: 22    End of Session Equipment Utilized During Treatment: Gait belt Activity Tolerance: Patient tolerated treatment well Patient left: in chair;with call bell/phone within reach;with chair alarm set Nurse Communication: Mobility  status PT Visit Diagnosis: Other abnormalities of gait and mobility (R26.89)    Time: 1937-9024 PT Time Calculation (min) (ACUTE ONLY): 15 min   Charges:   PT Evaluation $PT Eval Low Complexity: 1 Low          Van Clines, PT  Acute Rehabilitation Services Pager [phone] Office [phone]   Levi Aland 11/15/2020, 2:03 PM

## 2020-11-15 NOTE — Care Management Obs Status (Signed)
MEDICARE OBSERVATION STATUS NOTIFICATION   Patient Details  Name: CYNARA TATHAM MRN: [id number] Date of Birth: 02-01-1951   Medicare Observation Status Notification Given:  Waylan Boga, RN 11/15/2020, 7:04 PM

## 2020-11-15 NOTE — Evaluation (Signed)
Speech Language Pathology Evaluation Patient Details Name: Barbara Singleton MRN: [id number] DOB: [birth year] Today's Date: 11/15/2020 Time: 4970-2637 SLP Time Calculation (min) (ACUTE ONLY): 17 min  Problem List:  Patient Active Problem List   Diagnosis Date Noted   Stroke (cerebrum) (HCC) 11/14/2020   Stroke (HCC) 11/14/2020   Overweight (BMI 25.0-29.9) 07/19/2015   S/P right TKA 07/18/2015   Past Medical History:  Past Medical History:  Diagnosis Date   Arthritis    Osteoarthritis- knees.   Depression    Multiple sclerosis (HCC)    Neuromuscular disorder [facility])    Dr. Algis Downs Jeffrey,neurology- [place], neurology foolows last visit 3 montha ago.   Past Surgical History:  Past Surgical History:  Procedure Laterality Date   BACK SURGERY     fusions x2 lumbar.   BREAST EXCISIONAL BIOPSY Left    CESAREAN SECTION     DENTAL SURGERY     extractions   TONSILLECTOMY     TOTAL KNEE ARTHROPLASTY Right 07/18/2015   Procedure: TOTAL KNEE ARTHROPLASTY;  Surgeon: Durene Romans, MD;  Location: [facility];  Service: Orthopedics;  Laterality: Right;   TUBAL LIGATION     HPI:  Pt is a 70 y.o. female who presented with new onset of double vision and right-sided weakness. MRI brain: 6 mm focus of restricted diffusion and T2/FLAIR hyperintense signal abnormality within the medial left thalamus (without corresponding abnormal enhancement), most consistent with acute infarct. PMH: MS in remission, HLD, DJD, anxiety/depression.   Assessment / Plan / Recommendation Clinical Impression  Pt participated in speech/language evaluation. 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 Pt denied any baseline or acute deficits in speech, language, or cognition. Her speech and language skills are currently within normal limits and no overt cognitive deficits were noted during informal assessment  or completion of sections of the the SLUMS Exam. Further skilled SLP services are not clinically indicated at this time. Pt was educated regarding results and recommendations, and she verbalized understanding as well as agreement with plan of care.    SLP Assessment  SLP Recommendation/Assessment: Patient does not need any further Speech Lanaguage Pathology Services SLP Visit Diagnosis: Cognitive communication deficit (R41.841)    Follow Up Recommendations  None    Frequency and Duration           SLP Evaluation Cognition  Overall Cognitive Status: Within Functional Limits for tasks assessed (Simultaneous filing. User may not have seen previous data.) Arousal/Alertness: Awake/alert Orientation Level: Oriented X4 Attention: Focused;Sustained Focused Attention: Appears intact Sustained Attention: Appears intact Memory: Appears intact (Immediate: 5/5; delayed: 4/5; with cues: 1/1) Awareness: Appears intact Problem Solving: Appears intact       Comprehension  Auditory Comprehension Overall Auditory Comprehension: Appears within functional limits for tasks assessed Yes/No Questions: Within Functional Limits Basic Immediate Environment Questions:  (5/5) Complex Questions:  (5/5) Paragraph Comprehension (via yes/no questions):  (3/4) Commands: Within Functional Limits Two Step Basic Commands:  (4/4) Multistep Basic Commands:  (3/3) Conversation: Complex    Expression Expression Primary Mode of Expression: Verbal Verbal Expression Overall Verbal Expression: Appears within functional limits for tasks assessed Initiation: No impairment Automatic Speech: Counting;Day of week;Month of year Level of Generative/Spontaneous Verbalization: Conversation Repetition: No impairment (5/5) Naming: No impairment Pragmatics: No impairment Written Expression Dominant Hand: Right   Oral / Motor  Oral Motor/Sensory Function Overall Oral Motor/Sensory Function: Within functional limits Motor  Speech Overall  Motor Speech: Appears within functional limits for tasks assessed Respiration: Within functional limits Phonation: Normal Resonance: Within functional limits Articulation: Within functional limitis Intelligibility: Intelligible Motor Planning: Witnin functional limits Motor Speech Errors: Not applicable   Dalaina Tates I. Vear Clock, MS, CCC-SLP Acute Rehabilitation Services Office number [phone] Pager [phone]                    Scheryl Marten 11/15/2020, 1:50 PM

## 2020-11-15 NOTE — Progress Notes (Addendum)
STROKE TEAM PROGRESS NOTE   INTERVAL HISTORY Patient sitting up in  bed, able to follow commands, NAD.    MRI scan of the brain shows a small left medial thalamic lacunar infarct.  CT angiogram of the brain and neck showed no significant large vessel stenosis or occlusion.  Echocardiogram is pending.  Hemoglobin A1c is 5.9.  LDL cholesterol is 90 mg percent. Vitals:   11/14/20 2247 11/15/20 0043 11/15/20 0250 11/15/20 0421  BP: 134/78 120/79 128/78 112/72  Pulse: 60 65 60 (!) 52  Resp: 16 16 14 16   Temp: 98.4 F (36.9 C) 98.8 F (37.1 C) 98.4 F (36.9 C) 98.2 F (36.8 C)  TempSrc: Oral Oral Oral Oral  SpO2: 94% 94% 94% 92%  Weight:      Height:       CBC:  Recent Labs  Lab 11/14/20 0919 11/14/20 1001  WBC 4.1  --   NEUTROABS 2.4  --   HGB 13.6 13.9  HCT 42.7 41.0  MCV 92.8  --   PLT 227  --    Basic Metabolic Panel:  Recent Labs  Lab 11/14/20 0919 11/14/20 1001  NA 140 141  K 4.3 4.4  CL 106 107  CO2 31  --   GLUCOSE 107* 106*  BUN 12 12  CREATININE 0.75 0.70  CALCIUM 9.4  --    Lipid Panel:  Recent Labs  Lab 11/15/20 0410  CHOL 187  TRIG 109  HDL 75  CHOLHDL 2.5  VLDL 22  LDLCALC 90   HgbA1c:  Recent Labs  Lab 11/15/20 0410  HGBA1C 5.9*    IMAGING past 24 hours CT ANGIO HEAD W OR WO CONTRAST  Result Date: 11/14/2020 CLINICAL DATA:  Stroke/TIA EXAM: CT ANGIOGRAPHY HEAD AND NECK TECHNIQUE: Multidetector CT imaging of the head and neck was performed using the standard protocol during bolus administration of intravenous contrast. Multiplanar CT image reconstructions and MIPs were obtained to evaluate the vascular anatomy. Carotid stenosis measurements (when applicable) are obtained utilizing NASCET criteria, using the distal internal carotid diameter as the denominator. CONTRAST:  76mL OMNIPAQUE IOHEXOL 350 MG/ML SOLN COMPARISON:  Same day CT head and MRI. FINDINGS: CTA NECK FINDINGS Aortic arch: Great vessel origins are patent. Right carotid system: No  evidence of dissection, stenosis (50% or greater) or occlusion. Left carotid system: No evidence of dissection, stenosis (50% or greater) or occlusion. Vertebral arteries: Codominant. No evidence of dissection, stenosis (50% or greater) or occlusion. Skeleton: Slight retrolisthesis of C3 on C4. No evidence of acute fracture on limited assessment. Multilevel facet arthropathy. Other neck: No acute abnormality. Upper chest: Mild peripheral predominant subpleural reticulation in the lung apices bilaterally, potentially representing mild fibrosis/scarring. No consolidation. Review of the MIP images confirms the above findings CTA HEAD FINDINGS Anterior circulation: No large vessel occlusion or proximal flow limiting stenosis. Hypoplastic left A1 ACA. No aneurysm identified. Posterior circulation: Small vertebrobasilar system with bilateral fetal type PCAs, anatomic variant. This includes small bilateral vertebral arteries and a small basilar artery throughout. Bilateral posterior cerebral arteries are patent without proximal flow limiting stenosis. No aneurysm identified. Venous sinuses: As permitted by contrast timing, patent. Anatomic variants: As detailed above. Review of the MIP images confirms the above findings IMPRESSION: 1. No large vessel occlusion or proximal flow limiting stenosis in the head or neck. 2. Small vertebrobasilar system with bilateral fetal type PCAs, anatomic variant Electronically Signed   By: 72m MD   On: 11/14/2020 16:26   CT HEAD WO  CONTRAST  Result Date: 11/14/2020 CLINICAL DATA:  Neuro deficit with acute stroke suspected. Right-sided weakness and unsteady gait. EXAM: CT HEAD WITHOUT CONTRAST TECHNIQUE: Contiguous axial images were obtained from the base of the skull through the vertex without intravenous contrast. COMPARISON:  None. FINDINGS: Brain: No evidence of acute cortical infarction, hemorrhage, hydrocephalus, extra-axial collection or mass lesion/mass effect.  Notable streak artifact over the pons. Patchy low-density in the cerebral white matter attributed to chronic small vessel ischemia. Age normal brain volume. Vascular: No hyperdense vessel or unexpected calcification. Skull: Normal. Negative for fracture or focal lesion. Sinuses/Orbits: No acute finding. IMPRESSION: 1. No acute finding. 2. Chronic small vessel ischemia in the cerebral white matter. Electronically Signed   By: Marnee Spring M.D.   On: 11/14/2020 09:51   CT ANGIO NECK W OR WO CONTRAST  Result Date: 11/14/2020 CLINICAL DATA:  Stroke/TIA EXAM: CT ANGIOGRAPHY HEAD AND NECK TECHNIQUE: Multidetector CT imaging of the head and neck was performed using the standard protocol during bolus administration of intravenous contrast. Multiplanar CT image reconstructions and MIPs were obtained to evaluate the vascular anatomy. Carotid stenosis measurements (when applicable) are obtained utilizing NASCET criteria, using the distal internal carotid diameter as the denominator. CONTRAST:  11mL OMNIPAQUE IOHEXOL 350 MG/ML SOLN COMPARISON:  Same day CT head and MRI. FINDINGS: CTA NECK FINDINGS Aortic arch: Great vessel origins are patent. Right carotid system: No evidence of dissection, stenosis (50% or greater) or occlusion. Left carotid system: No evidence of dissection, stenosis (50% or greater) or occlusion. Vertebral arteries: Codominant. No evidence of dissection, stenosis (50% or greater) or occlusion. Skeleton: Slight retrolisthesis of C3 on C4. No evidence of acute fracture on limited assessment. Multilevel facet arthropathy. Other neck: No acute abnormality. Upper chest: Mild peripheral predominant subpleural reticulation in the lung apices bilaterally, potentially representing mild fibrosis/scarring. No consolidation. Review of the MIP images confirms the above findings CTA HEAD FINDINGS Anterior circulation: No large vessel occlusion or proximal flow limiting stenosis. Hypoplastic left A1 ACA. No aneurysm  identified. Posterior circulation: Small vertebrobasilar system with bilateral fetal type PCAs, anatomic variant. This includes small bilateral vertebral arteries and a small basilar artery throughout. Bilateral posterior cerebral arteries are patent without proximal flow limiting stenosis. No aneurysm identified. Venous sinuses: As permitted by contrast timing, patent. Anatomic variants: As detailed above. Review of the MIP images confirms the above findings IMPRESSION: 1. No large vessel occlusion or proximal flow limiting stenosis in the head or neck. 2. Small vertebrobasilar system with bilateral fetal type PCAs, anatomic variant Electronically Signed   By: Feliberto Harts MD   On: 11/14/2020 16:26   MR Brain W and Wo Contrast  Result Date: 11/14/2020 CLINICAL DATA:  Neuro deficit, acute, stroke suspected. Double vision, ataxia, history of MS, concern for stroke or MS flare. Patient reports right leg is weaker. L vision in right eye. Headache. EXAM: MRI HEAD WITHOUT AND WITH CONTRAST TECHNIQUE: Multiplanar, multiecho pulse sequences of the brain and surrounding structures were obtained without and with intravenous contrast. CONTRAST:  64mL GADAVIST GADOBUTROL 1 MMOL/ML IV SOLN COMPARISON:  Noncontrast head CT 11/14/2020. FINDINGS: Brain: Mild generalized cerebral and cerebellar atrophy. 6 mm focus of restricted diffusion and T2/FLAIR hyperintense signal abnormality within the medial left thalamus (series 3, image 29). There is no corresponding abnormal enhancement and this likely reflects an acute infarct. Moderate multifocal T2/FLAIR hyperintensity within the cerebral white matter, nonspecific. No evidence of an intracranial mass. No chronic intracranial blood products. No extra-axial fluid collection. No midline  shift. No abnormal intracranial enhancement. Vascular: Expected proximal arterial flow voids. Skull and upper cervical spine: No focal marrow lesion. Sinuses/Orbits: Visualized orbits show no acute  finding. Mild-to-moderate mucosal thickening within the right maxillary sinus. Other: Trace fluid within the bilateral mastoid air cells. IMPRESSION: 6 mm focus of restricted diffusion and T2/FLAIR hyperintense signal abnormality within the medial left thalamus (without corresponding abnormal enhancement), most consistent with acute infarct. Moderate multifocal T2/FLAIR hyperintensity within the cerebral white matter. These foci have a nonspecific imaging appearance, but may reflect sequela of demyelinating disease given the provided history of multiple sclerosis. However, a component of chronic small vessel ischemic disease is also suspected. Mild generalized parenchymal atrophy. Right maxillary sinus disease, as described. Trace fluid within the bilateral mastoid air cells. Electronically Signed   By: Jackey LogeKyle  Golden DO   On: 11/14/2020 12:40    PHYSICAL EXAM  Pleasant middle-aged Caucasian lady not in distress. . Afebrile. Head is nontraumatic. Neck is supple without bruit.    Cardiac exam no murmur or gallop. Lungs are clear to auscultation. Distal pulses are well felt.  Neurological Exam ;  Awake  Alert oriented x 3. Normal speech and language.eye movements full without nystagmus.fundi were not visualized. Vision acuity and fields appear normal. Hearing is normal. Palatal movements are normal. Face symmetric. Tongue midline. Normal strength, tone, reflexes and coordination. Normal sensation. Gait deferred.  ASSESSMENT/PLAN [name] is a 70 y.o. female with a PMHx of HLD, OA, depression, revision of posterior spinal fusion T10-S1 2018, and MS since 2006. Patient presented to the ED today for stroke like symptoms that began this morning. Patient states she went to bed at 12 mn and was in her usual state of health. At 0500 hours, she woke up to go to the bathroom. She had no difficulty walking there, but when she stood up to wash her hands, she noticed she was leaning to the right. She was able to  get herself back to bed thinking her symptoms would be gone after more rest. Around 0700 hours, she got OOB and was still having the gait disturbance and required the assistance of her husband to walk. Husband states that if he was not helping her, she would have fallen. Also, at 0700, she had new diplopia which goes away with closing either eye.  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 No acute finding. 2. Chronic small vessel ischemia in the cerebral white matter. CTA head & neck No LVO; Small vertebrobasilar system with bilateral fetal type PCAs,anatomic variant MRI  6 mm: focus of restricted diffusion and T2/FLAIR hyperintense signal abnormality within the medial left thalamus (series 3, image 29).There is no corresponding abnormal enhancement and this likely reflects an acute infarct. 2D Echo Pending LDL 90 HgbA1c 5.9 VTE prophylaxis - Lovenox    Diet   Diet Heart Room service appropriate? Yes; Fluid consistency: Thin   aspirin 81 mg daily prior to admission, now on aspirin 81 mg daily and clopidogrel 75 mg daily. Contine Aspirin and Plavix for 3 weeks and then Plavix only. Therapy recommendations:  No OT F/u; PT pending Disposition:  TBD  Hypertension Home meds:  none Stable Permissive hypertension (OK if < 220/120) but gradually normalize in 5-7 days Long-term BP goal normotensive  Hyperlipidemia Home meds:  lipitor 10 mg daily, resumed in hospital LDL 90, goal < 70 Increased Lipitor to 40 mg daily Continue statin at discharge  Diabetes type II Controlled  Home meds:  none HgbA1c 5.9, goal < 7.0  Other Stroke Risk Factors Advanced Age >/= 52  Obesity, Body mass index is 33.11 kg/m., BMI >/= 30 associated with increased stroke risk, recommend weight loss, diet and exercise as appropriate  Family hx stroke ( Father)   Other Active Problems MS  Hospital day # 1  Valentina Lucks, MSN, NP-C [facility]  Hospitalist [phone]  I have personally obtained history,examined this patient, reviewed notes, independently viewed imaging studies, participated in medical decision making and plan of care.ROS completed by me personally and pertinent positives fully documented  I have made any additions or clarifications directly to the above note. Agree with note above.  She presented with right-sided ataxia and gait imbalance secondary to left medial thalamic lacunar infarct likely from small vessel disease.  Continue ongoing stroke work-up.  Recommend aspirin and Plavix for 3 weeks followed by Plavix alone and aggressive risk factor modification.  Check echocardiogram results.  Continue ongoing therapies.  Greater than 50% time during this 35-minute visit was spent on counseling and coordination of care and discussion with care team.  Discussed with Dr. Jomarie Longs.  Delia Heady, MD Medical Director [facility] Pager: [phone] 11/15/2020 3:49 PM  To contact Stroke Continuity provider, please refer to [email or web address]. After hours, contact General Neurology

## 2020-11-16 DIAGNOSIS — I6300 Cerebral infarction due to thrombosis of unspecified precerebral artery: Secondary | ICD-10-CM | POA: Diagnosis not present

## 2020-11-16 DIAGNOSIS — I639 Cerebral infarction, unspecified: Secondary | ICD-10-CM | POA: Diagnosis not present

## 2020-11-16 MED ORDER — ATORVASTATIN CALCIUM 40 MG PO TABS: 40.0000 mg | ORAL_TABLET | Freq: Every day | ORAL | 0 refills | Status: AC

## 2020-11-16 MED ORDER — ACETAMINOPHEN 500 MG PO TABS: 1000.0000 mg | ORAL_TABLET | Freq: Three times a day (TID) | ORAL | 0 refills | Status: DC | PRN

## 2020-11-16 MED ORDER — CLOPIDOGREL BISULFATE 75 MG PO TABS: 75.0000 mg | ORAL_TABLET | Freq: Every day | ORAL | 0 refills | Status: AC

## 2020-11-16 MED ORDER — ASPIRIN 81 MG PO TBEC: 81.0000 mg | DELAYED_RELEASE_TABLET | Freq: Every day | ORAL | Status: AC

## 2020-11-16 NOTE — Progress Notes (Signed)
Physical Therapy Treatment Patient Details Name: Barbara Singleton MRN: [id number] DOB: 05/16/1950 Today's Date: 11/16/2020    History of Present Illness Barbara Singleton is a 70 y.o. female who presented with new onset of double vision and right-sided weakness; MRI showed acute left thalamus stroke; with medical history significant of MS in remission, HLD, DJD, anxiety/depression    PT Comments    Pt sitting up in chair on entry, agreeable to stair training prior to discharge. Pt is limited in safe mobility by historic gait deficits in presence of MS and scoliosis, despite increased energy required gait is functional and overall safe for supervision level assist. Pt able to ambulate 200 feet and practice stepping up and down step with L rail 6 times with supervision. RN entering on return to room to discharge pt. D/c plans remain appropriate.   Follow Up Recommendations  Outpatient PT;Supervision - Intermittent;Other (comment) (Pt declines Outpt PT, but tells me she will consider it later)     Equipment Recommendations       Recommendations for Other Services       Precautions / Restrictions Precautions Precautions: Fall Restrictions Weight Bearing Restrictions: No    Mobility  Bed Mobility               General bed mobility comments: OOB in recliner on entry    Transfers Overall transfer level: Needs assistance Equipment used: None Transfers: Sit to/from Stand Sit to Stand: Modified independent (Device/Increase time)         General transfer comment: Dependent on UEs to push, but overall good rise and steady at initial stand  Ambulation/Gait Ambulation/Gait assistance: Supervision Gait Distance (Feet): 200 Feet Assistive device: None Gait Pattern/deviations: Decreased step length - right;Decreased step length - left;Trunk flexed (Trunk forward and laterally flexed) Gait velocity: slowed Gait velocity interpretation: >2.62 ft/sec, indicative of community  ambulatory General Gait Details: Gait assymetries with scoliotic trunk forward and R laterally flexed; leads to energetically taxing gait pattern; pt states this is her baseline gait pattern   Stairs Stairs: Yes Stairs assistance: Supervision Stair Management: One rail Left;Forwards;Backwards Number of Stairs: 1 (1x6) General stair comments: supervision for safety, increased effort but safe ascent/descent of 1 step 6 times       Balance Overall balance assessment: Needs assistance   Sitting balance-Leahy Scale: Good       Standing balance-Leahy Scale: Fair Standing balance comment: Fair due to intermittent need of unilateral UE support and scoliotic posture.                            Cognition Arousal/Alertness: Awake/alert Behavior During Therapy: WFL for tasks assessed/performed Overall Cognitive Status: Within Functional Limits for tasks assessed                                 General Comments: A&Ox4. No cognitive deficits noted.         General Comments General comments (skin integrity, edema, etc.): VSS on RA, educated pt on need for hourly movement to keep strength and endurance, pt in full agreement      Pertinent Vitals/Pain Pain Assessment: No/denies pain     PT Goals (current goals can now be found in the care plan section) Acute Rehab PT Goals Patient Stated Goal: hopes to get home today PT Goal Formulation: With patient Time For Goal Achievement: 11/22/20 Potential to Achieve Goals: Good  Frequency    Min 4X/week      PT Plan Current plan remains appropriate       AM-PAC PT "6 Clicks" Mobility   Outcome Measure  Help needed turning from your back to your side while in a flat bed without using bedrails?: None Help needed moving from lying on your back to sitting on the side of a flat bed without using bedrails?: None Help needed moving to and from a bed to a chair (including a wheelchair)?: None Help needed  standing up from a chair using your arms (e.g., wheelchair or bedside chair)?: A Little Help needed to walk in hospital room?: None Help needed climbing 3-5 steps with a railing? : A Little 6 Click Score: 22    End of Session Equipment Utilized During Treatment: Gait belt Activity Tolerance: Patient tolerated treatment well Patient left: in chair;with call bell/phone within reach;with chair alarm set Nurse Communication: Mobility status PT Visit Diagnosis: Other abnormalities of gait and mobility (R26.89)     Time: 6834-1962 PT Time Calculation (min) (ACUTE ONLY): 9 min  Charges:  $Gait Training: 8-22 mins                     Takeesha Isley B. Beverely Risen PT, DPT Acute Rehabilitation Services Pager [phone] Office [phone]    Elon Alas Fleet 11/16/2020, 11:08 AM

## 2020-11-16 NOTE — Discharge Summary (Signed)
Physician Discharge Summary  Barbara Singleton ZOX:[id number] DOB: 05-Oct-1950 DOA: 11/14/2020  PCP: Blair Heys, MD  Admit date: 11/14/2020 Discharge date: 11/16/2020  Time spent: 35 minutes  Recommendations for Outpatient Follow-up:  [facility] in 4 to 6 weeks   Discharge Diagnoses:  Active Problems:   Stroke (cerebrum) (HCC)   Stroke [facility]) History of multiple sclerosis Hypertension Dyslipidemia Borderline diabetes  Discharge Condition: Stable  Diet recommendation: Carb modified, heart healthy  Filed Weights   11/14/20 2046  Weight: 76.9 kg    History of present illness:  70 year old female with history of MS in remission, dyslipidemia, DJD, anxiety, depression presented to the ED with new onset double vision and difficulty ambulating  Hospital Course:   Acute left thalamic infarct -Symptoms improving -MRI noted 6 mm focus of restricted diffusion in the medial left thalamus -Likely secondary to small vessel disease -CTA head and neck without large vessel occlusion -2D echocardiogram within normal limits -LDL is 90, hemoglobin A1c is 5.9 -PT OT, SLP evaluations completed, no follow-up recommended -Neurology following, on aspirin 81 mg prior to admission, now on aspirin 81 mg along with Plavix 75 mg daily for the 3 weeks followed by Plavix alone -Discharged home in a stable condition, follow-up with [facility] in 4 to 6 weeks   Hypertension -Stable, progressive hypertension   Dyslipidemia -Lipitor increased to 40 mg LDL was 90   Borderline diabetes -Hemoglobin A1c is 5.9   Multiple sclerosis -Stable, on ocrelizumab every 6      Discharge Exam: Vitals:   11/16/20 0054 11/16/20 0554  BP: 103/64 125/78  Pulse: (!) 55 65  Resp: 17 17  Temp: 97.7 F (36.5 C) 97.8 F (36.6 C)  SpO2: 95% 95%    General: AAOx3 Cardiovascular: S1-S2, regular rate rhythm Respiratory: Clear Abdomen: Soft, nontender, bowel sounds present Extremities: No  edema  Discharge Instructions   Discharge Instructions     Diet - low sodium heart healthy   Complete by: As directed    Increase activity slowly   Complete by: As directed       Allergies as of 11/16/2020   No Known Allergies      Medication List     STOP taking these medications    celecoxib 200 MG capsule Commonly known as: CELEBREX   docusate sodium 100 MG capsule Commonly known as: COLACE   ferrous sulfate 325 (65 FE) MG tablet   methocarbamol 500 MG tablet Commonly known as: ROBAXIN   oxyCODONE 5 MG immediate release tablet Commonly known as: Oxy IR/ROXICODONE   polyethylene glycol 17 g packet Commonly known as: MIRALAX / GLYCOLAX       TAKE these medications    acetaminophen 500 MG tablet Commonly known as: TYLENOL Take 2 tablets (1,000 mg total) by mouth every 8 (eight) hours as needed.   aspirin 81 MG EC tablet Take 1 tablet (81 mg total) by mouth daily for 21 days. Swallow whole.   atorvastatin 40 MG tablet Commonly known as: LIPITOR Take 1 tablet (40 mg total) by mouth daily. What changed:  medication strength how much to take   CALCIUM + D PO Take 1 tablet by mouth daily.   clopidogrel 75 MG tablet Commonly known as: PLAVIX Take 1 tablet (75 mg total) by mouth daily.   fluvoxaMINE 100 MG tablet Commonly known as: LUVOX Take 100 mg by mouth 3 (three) times daily.   lamoTRIgine 100 MG tablet Commonly known as: LAMICTAL Take 100 mg by mouth 2 (two)  times daily.   Latuda 80 MG Tabs tablet Generic drug: lurasidone Take 80 mg by mouth daily.   LORazepam 0.5 MG tablet Commonly known as: ATIVAN Take 0.5 mg by mouth daily as needed for anxiety.   methylphenidate 20 MG tablet Commonly known as: RITALIN Take 20 mg by mouth daily.   multivitamin with minerals Tabs tablet Take 1 tablet by mouth daily.   traZODone 100 MG tablet Commonly known as: DESYREL Take 100 mg by mouth at bedtime.   VITAMIN B 12 PO Take 1 tablet by mouth  daily.   vitamin C 500 MG tablet Commonly known as: ASCORBIC ACID Take 500 mg by mouth daily.       No Known Allergies  Follow-up Information     Micki RileySethi, Pramod S, MD. Go in 1 month(s).   Specialties: Neurology, Radiology Contact information: [address] [place] [address] [phone]                  The results of significant diagnostics from this hospitalization (including imaging, microbiology, ancillary and laboratory) are listed below for reference.    Significant Diagnostic Studies: CT ANGIO HEAD W OR WO CONTRAST  Result Date: 11/14/2020 CLINICAL DATA:  Stroke/TIA EXAM: CT ANGIOGRAPHY HEAD AND NECK TECHNIQUE: Multidetector CT imaging of the head and neck was performed using the standard protocol during bolus administration of intravenous contrast. Multiplanar CT image reconstructions and MIPs were obtained to evaluate the vascular anatomy. Carotid stenosis measurements (when applicable) are obtained utilizing NASCET criteria, using the distal internal carotid diameter as the denominator. CONTRAST:  75mL OMNIPAQUE IOHEXOL 350 MG/ML SOLN COMPARISON:  Same day CT head and MRI. FINDINGS: CTA NECK FINDINGS Aortic arch: Great vessel origins are patent. Right carotid system: No evidence of dissection, stenosis (50% or greater) or occlusion. Left carotid system: No evidence of dissection, stenosis (50% or greater) or occlusion. Vertebral arteries: Codominant. No evidence of dissection, stenosis (50% or greater) or occlusion. Skeleton: Slight retrolisthesis of C3 on C4. No evidence of acute fracture on limited assessment. Multilevel facet arthropathy. Other neck: No acute abnormality. Upper chest: Mild peripheral predominant subpleural reticulation in the lung apices bilaterally, potentially representing mild fibrosis/scarring. No consolidation. Review of the MIP images confirms the above findings CTA HEAD FINDINGS Anterior circulation: No large vessel occlusion or  proximal flow limiting stenosis. Hypoplastic left A1 ACA. No aneurysm identified. Posterior circulation: Small vertebrobasilar system with bilateral fetal type PCAs, anatomic variant. This includes small bilateral vertebral arteries and a small basilar artery throughout. Bilateral posterior cerebral arteries are patent without proximal flow limiting stenosis. No aneurysm identified. Venous sinuses: As permitted by contrast timing, patent. Anatomic variants: As detailed above. Review of the MIP images confirms the above findings IMPRESSION: 1. No large vessel occlusion or proximal flow limiting stenosis in the head or neck. 2. Small vertebrobasilar system with bilateral fetal type PCAs, anatomic variant Electronically Signed   By: Feliberto HartsFrederick S Jones MD   On: 11/14/2020 16:26   CT HEAD WO CONTRAST  Result Date: 11/14/2020 CLINICAL DATA:  Neuro deficit with acute stroke suspected. Right-sided weakness and unsteady gait. EXAM: CT HEAD WITHOUT CONTRAST TECHNIQUE: Contiguous axial images were obtained from the base of the skull through the vertex without intravenous contrast. COMPARISON:  None. FINDINGS: Brain: No evidence of acute cortical infarction, hemorrhage, hydrocephalus, extra-axial collection or mass lesion/mass effect. Notable streak artifact over the pons. Patchy low-density in the cerebral white matter attributed to chronic small vessel ischemia. Age normal brain volume. Vascular:  No hyperdense vessel or unexpected calcification. Skull: Normal. Negative for fracture or focal lesion. Sinuses/Orbits: No acute finding. IMPRESSION: 1. No acute finding. 2. Chronic small vessel ischemia in the cerebral white matter. Electronically Signed   By: Marnee Spring M.D.   On: 11/14/2020 09:51   CT ANGIO NECK W OR WO CONTRAST  Result Date: 11/14/2020 CLINICAL DATA:  Stroke/TIA EXAM: CT ANGIOGRAPHY HEAD AND NECK TECHNIQUE: Multidetector CT imaging of the head and neck was performed using the standard protocol during  bolus administration of intravenous contrast. Multiplanar CT image reconstructions and MIPs were obtained to evaluate the vascular anatomy. Carotid stenosis measurements (when applicable) are obtained utilizing NASCET criteria, using the distal internal carotid diameter as the denominator. CONTRAST:  34mL OMNIPAQUE IOHEXOL 350 MG/ML SOLN COMPARISON:  Same day CT head and MRI. FINDINGS: CTA NECK FINDINGS Aortic arch: Great vessel origins are patent. Right carotid system: No evidence of dissection, stenosis (50% or greater) or occlusion. Left carotid system: No evidence of dissection, stenosis (50% or greater) or occlusion. Vertebral arteries: Codominant. No evidence of dissection, stenosis (50% or greater) or occlusion. Skeleton: Slight retrolisthesis of C3 on C4. No evidence of acute fracture on limited assessment. Multilevel facet arthropathy. Other neck: No acute abnormality. Upper chest: Mild peripheral predominant subpleural reticulation in the lung apices bilaterally, potentially representing mild fibrosis/scarring. No consolidation. Review of the MIP images confirms the above findings CTA HEAD FINDINGS Anterior circulation: No large vessel occlusion or proximal flow limiting stenosis. Hypoplastic left A1 ACA. No aneurysm identified. Posterior circulation: Small vertebrobasilar system with bilateral fetal type PCAs, anatomic variant. This includes small bilateral vertebral arteries and a small basilar artery throughout. Bilateral posterior cerebral arteries are patent without proximal flow limiting stenosis. No aneurysm identified. Venous sinuses: As permitted by contrast timing, patent. Anatomic variants: As detailed above. Review of the MIP images confirms the above findings IMPRESSION: 1. No large vessel occlusion or proximal flow limiting stenosis in the head or neck. 2. Small vertebrobasilar system with bilateral fetal type PCAs, anatomic variant Electronically Signed   By: Feliberto Harts MD   On:  11/14/2020 16:26   MR Brain W and Wo Contrast  Result Date: 11/14/2020 CLINICAL DATA:  Neuro deficit, acute, stroke suspected. Double vision, ataxia, history of MS, concern for stroke or MS flare. Patient reports right leg is weaker. L vision in right eye. Headache. EXAM: MRI HEAD WITHOUT AND WITH CONTRAST TECHNIQUE: Multiplanar, multiecho pulse sequences of the brain and surrounding structures were obtained without and with intravenous contrast. CONTRAST:  5mL GADAVIST GADOBUTROL 1 MMOL/ML IV SOLN COMPARISON:  Noncontrast head CT 11/14/2020. FINDINGS: Brain: Mild generalized cerebral and cerebellar atrophy. 6 mm focus of restricted diffusion and T2/FLAIR hyperintense signal abnormality within the medial left thalamus (series 3, image 29). There is no corresponding abnormal enhancement and this likely reflects an acute infarct. Moderate multifocal T2/FLAIR hyperintensity within the cerebral white matter, nonspecific. No evidence of an intracranial mass. No chronic intracranial blood products. No extra-axial fluid collection. No midline shift. No abnormal intracranial enhancement. Vascular: Expected proximal arterial flow voids. Skull and upper cervical spine: No focal marrow lesion. Sinuses/Orbits: Visualized orbits show no acute finding. Mild-to-moderate mucosal thickening within the right maxillary sinus. Other: Trace fluid within the bilateral mastoid air cells. IMPRESSION: 6 mm focus of restricted diffusion and T2/FLAIR hyperintense signal abnormality within the medial left thalamus (without corresponding abnormal enhancement), most consistent with acute infarct. Moderate multifocal T2/FLAIR hyperintensity within the cerebral white matter. These foci have a nonspecific imaging  appearance, but may reflect sequela of demyelinating disease given the provided history of multiple sclerosis. However, a component of chronic small vessel ischemic disease is also suspected. Mild generalized parenchymal atrophy. Right  maxillary sinus disease, as described. Trace fluid within the bilateral mastoid air cells. Electronically Signed   By: Jackey Loge DO   On: 11/14/2020 12:40   ECHOCARDIOGRAM COMPLETE  Result Date: 11/15/2020    ECHOCARDIOGRAM REPORT   Patient Name:   [name] Date of Exam: 11/15/2020 Medical Rec #:  [id number]       Height:       60.0 in Accession #:    [id number]      Weight:       169.5 lb Date of Birth:  [birth year]      BSA:          1.740 m Patient Age:    69 years        BP:           112/72 mmHg Patient Gender: F               HR:           67 bpm. Exam Location:  Inpatient Procedure: 2D Echo, Cardiac Doppler and Color Doppler Indications:    Stroke I63.9  History:        Patient has no prior history of Echocardiogram examinations.                 Multiple Sclerosis.  Sonographer:    Elmarie Shiley Dance Referring Phys: [id number] Emeline General IMPRESSIONS  1. Left ventricular ejection fraction, by estimation, is 55 to 60%. The left ventricle has normal function. The left ventricle has no regional wall motion abnormalities. Left ventricular diastolic parameters are indeterminate.  2. Right ventricular systolic function is normal. The right ventricular size is normal. Tricuspid regurgitation signal is inadequate for assessing PA pressure.  3. Left atrial size was moderately dilated.  4. The mitral valve is normal in structure. Trivial mitral valve regurgitation. No evidence of mitral stenosis.  5. The aortic valve is tricuspid. There is mild calcification of the aortic valve. Aortic valve regurgitation is not visualized. Mild aortic valve sclerosis is present, with no evidence of aortic valve stenosis.  6. The inferior vena cava is normal in size with greater than 50% respiratory variability, suggesting right atrial pressure of 3 mmHg. Comparison(s): No prior Echocardiogram. Conclusion(s)/Recommendation(s): Otherwise normal echocardiogram, with minor abnormalities described in the report. No intracardiac source  of embolism detected on this transthoracic study. A transesophageal echocardiogram is recommended to exclude cardiac source of embolism if clinically indicated. FINDINGS  Left Ventricle: Left ventricular ejection fraction, by estimation, is 55 to 60%. The left ventricle has normal function. The left ventricle has no regional wall motion abnormalities. The left ventricular internal cavity size was normal in size. There is  no left ventricular hypertrophy. Left ventricular diastolic parameters are indeterminate. Right Ventricle: The right ventricular size is normal. No increase in right ventricular wall thickness. Right ventricular systolic function is normal. Tricuspid regurgitation signal is inadequate for assessing PA pressure. Left Atrium: Left atrial size was moderately dilated. Right Atrium: Right atrial size was normal in size. Pericardium: There is no evidence of pericardial effusion. Mitral Valve: Mitral valve bowing without prolapse. The mitral valve is normal in structure. Trivial mitral valve regurgitation. No evidence of mitral valve stenosis. Tricuspid Valve: The tricuspid valve is normal in structure. Tricuspid valve regurgitation is trivial. No evidence of tricuspid  stenosis. Aortic Valve: The aortic valve is tricuspid. There is mild calcification of the aortic valve. Aortic valve regurgitation is not visualized. Mild aortic valve sclerosis is present, with no evidence of aortic valve stenosis. Pulmonic Valve: The pulmonic valve was grossly normal. Pulmonic valve regurgitation is trivial. No evidence of pulmonic stenosis. Aorta: The aortic root, ascending aorta, aortic arch and descending aorta are all structurally normal, with no evidence of dilitation or obstruction. Venous: The inferior vena cava is normal in size with greater than 50% respiratory variability, suggesting right atrial pressure of 3 mmHg. IAS/Shunts: The atrial septum is grossly normal.  LEFT VENTRICLE PLAX 2D LVIDd:         4.20 cm   Diastology LVIDs:         2.60 cm  LV e' medial:    5.77 cm/s LV PW:         1.20 cm  LV E/e' medial:  10.8 LV IVS:        0.90 cm  LV e' lateral:   8.55 cm/s LVOT diam:     1.80 cm  LV E/e' lateral: 7.3 LV SV:         54 LV SV Index:   31 LVOT Area:     2.54 cm  RIGHT VENTRICLE             IVC RV Basal diam:  1.90 cm     IVC diam: 1.60 cm RV S prime:     19.10 cm/s TAPSE (M-mode): 2.0 cm LEFT ATRIUM           Index       RIGHT ATRIUM           Index LA diam:      3.20 cm 1.84 cm/m  RA Area:     10.30 cm LA Vol (A2C): 89.6 ml 51.49 ml/m RA Volume:   19.10 ml  10.98 ml/m LA Vol (A4C): 60.1 ml 34.54 ml/m  AORTIC VALVE LVOT Vmax:   91.70 cm/s LVOT Vmean:  63.000 cm/s LVOT VTI:    0.214 m  AORTA Ao Root diam: 3.80 cm Ao Asc diam:  3.30 cm MITRAL VALVE MV Area (PHT): 2.32 cm    SHUNTS MV Decel Time: 327 msec    Systemic VTI:  0.21 m MV E velocity: 62.40 cm/s  Systemic Diam: 1.80 cm MV A velocity: 70.00 cm/s MV E/A ratio:  0.89 Jodelle Red MD Electronically signed by Jodelle Red MD Signature Date/Time: 11/15/2020/3:23:59 PM    Final     Microbiology: Recent Results (from the past 240 hour(s))  SARS CORONAVIRUS 2 (TAT 6-24 HRS) Nasopharyngeal Nasopharyngeal Swab     Status: None   Collection Time: 11/14/20  4:26 PM   Specimen: Nasopharyngeal Swab  Result Value Ref Range Status   SARS Coronavirus 2 NEGATIVE NEGATIVE Final    Comment: (NOTE) SARS-CoV-2 target nucleic acids are NOT DETECTED.  The SARS-CoV-2 RNA is generally detectable in upper and lower respiratory specimens during the acute phase of infection. Negative results do not preclude SARS-CoV-2 infection, do not rule out co-infections with other pathogens, and should not be used as the sole basis for treatment or other patient management decisions. Negative results must be combined with clinical observations, patient history, and epidemiological information. The expected result is Negative.  Fact Sheet for  Patients: HairSlick.no  Fact Sheet for Healthcare Providers: quierodirigir.com  This test is not yet approved or cleared by the [place] FDA and  has been authorized for  detection and/or diagnosis of SARS-CoV-2 by FDA under an Emergency Use Authorization (EUA). This EUA will remain  in effect (meaning this test can be used) for the duration of the COVID-19 declaration under Se ction 564(b)(1) of the Act, 21 U.S.C. section 360bbb-3(b)(1), unless the authorization is terminated or revoked sooner.  Performed at [facility], [address], [place], [place] [address]      Labs: Basic Metabolic Panel: Recent Labs  Lab 11/14/20 0919 11/14/20 1001  NA 140 141  K 4.3 4.4  CL 106 107  CO2 31  --   GLUCOSE 107* 106*  BUN 12 12  CREATININE 0.75 0.70  CALCIUM 9.4  --    Liver Function Tests: Recent Labs  Lab 11/14/20 0919  AST 24  ALT 24  ALKPHOS 80  BILITOT 0.4  PROT 6.4*  ALBUMIN 3.8   No results for input(s): LIPASE, AMYLASE in the last 168 hours. No results for input(s): AMMONIA in the last 168 hours. CBC: Recent Labs  Lab 11/14/20 0919 11/14/20 1001  WBC 4.1  --   NEUTROABS 2.4  --   HGB 13.6 13.9  HCT 42.7 41.0  MCV 92.8  --   PLT 227  --    Cardiac Enzymes: No results for input(s): CKTOTAL, CKMB, CKMBINDEX, TROPONINI in the last 168 hours. BNP: BNP (last 3 results) No results for input(s): BNP in the last 8760 hours.  ProBNP (last 3 results) No results for input(s): PROBNP in the last 8760 hours.  CBG: No results for input(s): GLUCAP in the last 168 hours.     Signed:  Zannie Cove MD.  [facility] 11/16/2020, 2:52 PM

## 2020-11-16 NOTE — Progress Notes (Signed)
STROKE TEAM PROGRESS NOTE   INTERVAL HISTORY Patient sitting up in side chair.  She still has some mild residual paresthesias but it is improving.  Echocardiogram shows EF of 55 to 60% without cardiac source of embolism. Vitals:   11/15/20 1249 11/15/20 1813 11/16/20 0054 11/16/20 0554  BP: 102/64 109/70 103/64 125/78  Pulse: 64 64 (!) 55 65  Resp: 16 16 17 17   Temp: 98.9 F (37.2 C) 98.6 F (37 C) 97.7 F (36.5 C) 97.8 F (36.6 C)  TempSrc: Oral Oral Oral Oral  SpO2: 96% 97% 95% 95%  Weight:      Height:       CBC:  Recent Labs  Lab 11/14/20 0919 11/14/20 1001  WBC 4.1  --   NEUTROABS 2.4  --   HGB 13.6 13.9  HCT 42.7 41.0  MCV 92.8  --   PLT 227  --    Basic Metabolic Panel:  Recent Labs  Lab 11/14/20 0919 11/14/20 1001  NA 140 141  K 4.3 4.4  CL 106 107  CO2 31  --   GLUCOSE 107* 106*  BUN 12 12  CREATININE 0.75 0.70  CALCIUM 9.4  --    Lipid Panel:  Recent Labs  Lab 11/15/20 0410  CHOL 187  TRIG 109  HDL 75  CHOLHDL 2.5  VLDL 22  LDLCALC 90   HgbA1c:  Recent Labs  Lab 11/15/20 0410  HGBA1C 5.9*    IMAGING past 24 hours No results found.  PHYSICAL EXAM  Pleasant middle-aged Caucasian lady not in distress. . Afebrile. Head is nontraumatic. Neck is supple without bruit.    Cardiac exam no murmur or gallop. Lungs are clear to auscultation. Distal pulses are well felt.  Neurological Exam ;  Awake  Alert oriented x 3. Normal speech and language.eye movements full without nystagmus.fundi were not visualized. Vision acuity and fields appear normal. Hearing is normal. Palatal movements are normal. Face symmetric. Tongue midline. Normal strength, tone, reflexes and coordination. Normal sensation. Gait deferred.  ASSESSMENT/PLAN Barbara Singleton is a 70 y.o. female with a PMHx of HLD, OA, depression, revision of posterior spinal fusion T10-S1 2018, and MS since 2006. Patient presented to the ED today for stroke like symptoms that began this  morning. Patient states she went to bed at 12 mn and was in her usual state of health. At 0500 hours, she woke up to go to the bathroom. She had no difficulty walking there, but when she stood up to wash her hands, she noticed she was leaning to the right. She was able to get herself back to bed thinking her symptoms would be gone after more rest. Around 0700 hours, she got OOB and was still having the gait disturbance and required the assistance of her husband to walk. Husband states that if he was not helping her, she would have fallen. Also, at 0700, she had new diplopia which goes away with closing either eye.  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 No acute finding. 2. Chronic small vessel ischemia in the cerebral white matter. CTA head & neck No LVO; Small vertebrobasilar system with bilateral fetal type PCAs,anatomic variant MRI  6 mm: focus of restricted diffusion and T2/FLAIR hyperintense signal abnormality within the medial left thalamus (series 3, image 29).There is no corresponding abnormal enhancement and this likely reflects an acute infarct. 2D Echo Pending LDL 90 HgbA1c 5.9 VTE prophylaxis - Lovenox  Diet   Diet Heart Room service appropriate? Yes; Fluid consistency: Thin   aspirin 81 mg daily prior to admission, now on aspirin 81 mg daily and clopidogrel 75 mg daily. Contine Aspirin and Plavix for 3 weeks and then Plavix only. Therapy recommendations:  No OT F/u; PT pending Disposition:  TBD  Hypertension Home meds:  none Stable Permissive hypertension (OK if < 220/120) but gradually normalize in 5-7 days Long-term BP goal normotensive  Hyperlipidemia Home meds:  lipitor 10 mg daily, resumed in hospital LDL 90, goal < 70 Increased Lipitor to 40 mg daily Continue statin at discharge  Diabetes type II Controlled Home meds:  none HgbA1c 5.9, goal < 7.0  Other Stroke Risk Factors Advanced Age >/= 37   Obesity, Body mass index is 33.11 kg/m., BMI >/= 30 associated with increased stroke risk, recommend weight loss, diet and exercise as appropriate  Family hx stroke ( Father)   Other Active Problems MS  Hospital day # 1    She presented with right-sided ataxia and gait imbalance secondary to left medial thalamic lacunar infarct likely from small vessel disease.  Continue ongoing stroke work-up.  Recommend aspirin and Plavix for 3 weeks followed by Plavix alone and aggressive risk factor modification.    Continue ongoing therapies.  Greater than 50% time during this 25-minute visit was spent on counseling and coordination of care and discussion with care team.  Discussed with Dr. Jomarie Longs.  Follow-up as an outpatient with stroke clinic in 6 to 8 weeks.  Barbara Heady, MD Medical Director [facility] Pager: [phone] 11/16/2020 2:42 PM  To contact Stroke Continuity provider, please refer to [email or web address]. After hours, contact General Neurology

## 2020-11-30 DIAGNOSIS — G35 Multiple sclerosis: Secondary | ICD-10-CM | POA: Diagnosis not present

## 2020-12-05 ENCOUNTER — Telehealth: Payer: Self-pay | Admitting: Neurology

## 2020-12-05 NOTE — Telephone Encounter (Signed)
Pt states while she was in the hospital Dr Pearlean Brownie put her on Plavix & Atorvastatin, she will not have enough to last until her 8-15 appointment with Jessica,NP, Pt would like to know if more could be called in to last her to 08-15 to [facility] [phone]

## 2020-12-05 NOTE — Telephone Encounter (Signed)
I spoke with Shanda Bumps verbally on this Per [facility] D/C summary. Pt was to continue plavix/atorvastatin long term for stroke prevention.  Pt called and advised refill will need to come from pcp due to the med being a long term med.  Pt was agreeable to calling Dr. Manus Gunning. Pt will keep f/u as scheduled for 12/25/20 at [facility].

## 2020-12-12 ENCOUNTER — Ambulatory Visit: Payer: Medicare PPO | Admitting: Dermatology

## 2020-12-12 ENCOUNTER — Other Ambulatory Visit: Payer: Self-pay

## 2020-12-12 ENCOUNTER — Encounter: Payer: Self-pay | Admitting: Dermatology

## 2020-12-12 DIAGNOSIS — L409 Psoriasis, unspecified: Secondary | ICD-10-CM | POA: Diagnosis not present

## 2020-12-12 DIAGNOSIS — D692 Other nonthrombocytopenic purpura: Secondary | ICD-10-CM | POA: Diagnosis not present

## 2020-12-12 MED ORDER — CLOBETASOL PROPIONATE 0.05 % EX FOAM: Freq: Two times a day (BID) | CUTANEOUS | 6 refills | Status: AC

## 2020-12-12 NOTE — Patient Instructions (Signed)
Over the counter Dermend 

## 2020-12-25 ENCOUNTER — Other Ambulatory Visit: Payer: Self-pay

## 2020-12-25 ENCOUNTER — Encounter: Payer: Self-pay | Admitting: Adult Health

## 2020-12-25 ENCOUNTER — Ambulatory Visit: Payer: Medicare PPO | Admitting: Adult Health

## 2020-12-25 VITALS — BP 101/69 | HR 77 | Ht 61.0 in | Wt 162.0 lb

## 2020-12-25 DIAGNOSIS — I6381 Other cerebral infarction due to occlusion or stenosis of small artery: Secondary | ICD-10-CM

## 2020-12-25 DIAGNOSIS — I639 Cerebral infarction, unspecified: Secondary | ICD-10-CM | POA: Diagnosis not present

## 2020-12-25 DIAGNOSIS — E785 Hyperlipidemia, unspecified: Secondary | ICD-10-CM | POA: Diagnosis not present

## 2020-12-25 NOTE — Patient Instructions (Signed)
Continue clopidogrel 75 mg daily  and atorvastatin 40 mg daily for secondary stroke prevention  Continue to follow up with PCP regarding cholesterol management  Maintain strict control of cholesterol with LDL cholesterol (bad cholesterol) goal below 70 mg/dL.       Followup in the future with me in 6 months or call earlier if needed       Thank you for coming to see [place] at [facility]. I hope we have been able to provide you high quality care today.  You may receive a patient satisfaction survey over the next few weeks. We would appreciate your feedback and comments so that we may continue to improve ourselves and the health of our patients.

## 2020-12-25 NOTE — Progress Notes (Signed)
I agree with the above plan 

## 2020-12-25 NOTE — Progress Notes (Signed)
[facility] [address] [place]. [place] [address] [phone]       HOSPITAL FOLLOW UP NOTE  [name] Date of Birth:  April 18, 1951 Medical Record Number:  [id number]   Reason for Referral:  hospital stroke follow up    SUBJECTIVE:   CHIEF COMPLAINT:  Chief Complaint  Patient presents with   Follow-up    RM  3 alone Pt is well and stable, no complaints     HPI:   [name] is a 70 y.o. female with a PMHx of HLD, OA, depression, revision of posterior spinal fusion T10-S1 2018, and MS since 2006. Patient presented to [facility] ED on 11/14/2020 for right-sided ataxia and gait impairment present reviewed hospitalization pertinent progress notes, lab work and imaging.  Evaluated by Dr. 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  CTA head/neck negative LVO.  EF 55 to 60%.  LDL 90.  A1c 5.9.  Recommended DAPT for 3 weeks then Plavix alone as on aspirin PTA as well as increased home dose atorvastatin from 10 mg to 40 mg daily.  No prior stroke history.  PT/OT evaluations with no follow up recommended.  Today, 12/25/2020, Barbara Singleton is being seen for hospital follow-up unaccompanied.  She has been doing well since discharge without residual stroke deficits or new stroke/TIA symptoms.  Long-term use of cane for ambulation for known MS without any recent falls.  Completed 3 weeks DAPT and remains on Plavix alone as well as atorvastatin 40 mg daily tolerating without side effects.  Blood pressure today 101/69.  Does not routinely monitor at home.  No concerns at this time.    Pertinent imaging/labs  Code Stroke CT head 1. No acute finding. 2. Chronic small vessel ischemia in the cerebral white matter. CTA head & neck No LVO; Small vertebrobasilar system with bilateral fetal type PCAs,anatomic variant MRI  6 mm: focus of restricted diffusion and T2/FLAIR hyperintense signal abnormality within the medial left thalamus (series 3, image  29).There is no corresponding abnormal enhancement and this likely reflects an acute infarct. 2D Echo 55 to 60% LDL 90 HgbA1c 5.9     ROS:   14 system review of systems performed and negative with exception of no complaints  PMH:  Past Medical History:  Diagnosis Date   Arthritis    Osteoarthritis- knees.   Depression    Multiple sclerosis (HCC)    Neuromuscular disorder [facility])    Dr. Algis Downs Jeffrey,neurology- [place], neurology foolows last visit 3 montha ago.    PSH:  Past Surgical History:  Procedure Laterality Date   BACK SURGERY     fusions x2 lumbar.   BREAST EXCISIONAL BIOPSY Left    CESAREAN SECTION     DENTAL SURGERY     extractions   TONSILLECTOMY     TOTAL KNEE ARTHROPLASTY Right 07/18/2015   Procedure: TOTAL KNEE ARTHROPLASTY;  Surgeon: Durene Romans, MD;  Location: [facility] ORS;  Service: Orthopedics;  Laterality: Right;   TUBAL LIGATION      Social History:  Social History   Socioeconomic History   Marital status: Married    Spouse name: Not on file   Number of children: Not on file   Years of education: Not on file   Highest education level: Not on file  Occupational History   Not on file  Tobacco Use   Smoking status: Former    Types: Cigarettes   Smokeless tobacco: Not on file   Tobacco  comments:    social"-college years"  Vaping Use   Vaping Use: Never used  Substance and Sexual Activity   Alcohol use: No   Drug use: No   Sexual activity: Not on file  Other Topics Concern   Not on file  Social History Narrative   Not on file   Social Determinants of Health   Financial Resource Strain: Not on file  Food Insecurity: Not on file  Transportation Needs: Not on file  Physical Activity: Not on file  Stress: Not on file  Social Connections: Not on file  Intimate Partner Violence: Not on file    Family History:  Family History  Problem Relation Age of Onset   CVA Mother    Brain cancer Father     Medications:   Current  Outpatient Medications on File Prior to Visit  Medication Sig Dispense Refill   acetaminophen (TYLENOL) 500 MG tablet Take 2 tablets (1,000 mg total) by mouth every 8 (eight) hours as needed. 30 tablet 0   atorvastatin (LIPITOR) 40 MG tablet Take 1 tablet (40 mg total) by mouth daily. 30 tablet 0   Calcium Citrate-Vitamin D (CALCIUM + D PO) Take 1 tablet by mouth daily.     clobetasol (OLUX) 0.05 % topical foam Apply topically 2 (two) times daily. 50 g 6   clopidogrel (PLAVIX) 75 MG tablet Take 1 tablet (75 mg total) by mouth daily. 30 tablet 0   Cyanocobalamin (VITAMIN B 12 PO) Take 1 tablet by mouth daily.     fluvoxaMINE (LUVOX) 100 MG tablet Take 100 mg by mouth 3 (three) times daily.      lamoTRIgine (LAMICTAL) 100 MG tablet Take 100 mg by mouth 2 (two) times daily.      LATUDA 80 MG TABS tablet Take 80 mg by mouth daily.     LORazepam (ATIVAN) 0.5 MG tablet Take 0.5 mg by mouth daily as needed for anxiety.     methylphenidate (RITALIN) 20 MG tablet Take 20 mg by mouth daily.     Multiple Vitamin (MULTIVITAMIN WITH MINERALS) TABS tablet Take 1 tablet by mouth daily.     traZODone (DESYREL) 100 MG tablet Take 100 mg by mouth at bedtime.     vitamin C (ASCORBIC ACID) 500 MG tablet Take 500 mg by mouth daily.     No current facility-administered medications on file prior to visit.    Allergies:  No Known Allergies    OBJECTIVE:  Physical Exam  Vitals:   12/25/20 1405  BP: 101/69  Pulse: 77  Weight: 162 lb (73.5 kg)  Height: 5\' 1"  (1.549 m)   Body mass index is 30.61 kg/m. No results found.  Post stroke PHQ 2/9 Depression screen PHQ 2/9 12/25/2020  Decreased Interest 0  Down, Depressed, Hopeless 0  PHQ - 2 Score 0     General: well developed, well nourished, very pleasant middle-age Caucasian female, seated, in no evident distress Head: head normocephalic and atraumatic.   Neck: supple with no carotid or supraclavicular bruits Cardiovascular: regular rate and rhythm,  no murmurs Musculoskeletal: no deformity Skin:  no rash/petichiae Vascular:  Normal pulses all extremities   Neurologic Exam Mental Status: Awake and fully alert.  Fluent speech and language.  Oriented to place and time. Recent and remote memory intact. Attention span, concentration and fund of knowledge appropriate. Mood and affect appropriate.  Cranial Nerves: Fundoscopic exam reveals sharp disc margins. Pupils equal, briskly reactive to light. Extraocular movements full without nystagmus. Visual fields full  to confrontation. Hearing intact. Facial sensation intact. Face, tongue, palate moves normally and symmetrically.  Motor: Normal bulk and tone. Normal strength in all tested extremity muscles Sensory.: intact to touch , pinprick , position and vibratory sensation.  Coordination: Rapid alternating movements normal in all extremities. Finger-to-nose and heel-to-shin performed accurately bilaterally. Gait and Station: Arises from chair without difficulty. Stance is slightly hunched. Gait demonstrates normal stride length and balance with use of cane. Tandem walk and heel toe not attempted.  Reflexes: 1+ and symmetric. Toes downgoing.     NIHSS  0 Modified Rankin  0      ASSESSMENT: Barbara Singleton is a 70 y.o. year old female with recent left thalamic infarct secondary to small vessel disease on 11/14/2020 after presenting with right-sided ataxia and gait impairment.  Vascular risk factors include MS since 2006, HLD and advanced age.      PLAN:  Left thalamic stroke : Recovered well without residual deficit.  Continue clopidogrel 75 mg daily  and atorvastatin 40 mg daily for secondary stroke prevention.  Discussed secondary stroke prevention measures and importance of close PCP follow up for aggressive stroke risk factor management. I have gone over the pathophysiology of stroke, warning signs and symptoms, risk factors and their management in some detail with instructions to go to the  closest emergency room for symptoms of concern. HLD: LDL goal <70. Recent LDL 90 -continue atorvastatin 40 mg daily.  Recommend follow-up with PCP in the next 1 to 2 months for repeat lipid panel and ongoing prescribing of statin    Follow up in 6 months or call earlier if needed   CC:  [facility] provider: Dr. Pearlean Brownie PCP: Blair Heys, MD    I spent 46 minutes of face-to-face and non-face-to-face time with patient.  This included previsit chart review including review of recent hospitalization, lab review, study review, order entry, electronic health record documentation, patient education regarding recent stroke including etiology, secondary stroke prevention measures and importance of managing stroke risk factors and answered all other questions to patient satisfaction   Ihor Austin, AGNP-BC  [facility] [address] [place], [place] [address]  Phone [phone] Fax [phone] Note: This document was prepared with digital dictation and possible smart phrase technology. Any transcriptional errors that result from this process are unintentional.

## 2020-12-28 ENCOUNTER — Other Ambulatory Visit: Payer: Self-pay

## 2020-12-28 ENCOUNTER — Non-Acute Institutional Stay (HOSPITAL_COMMUNITY)
Admission: RE | Admit: 2020-12-28 | Discharge: 2020-12-28 | Disposition: A | Discharge status: Home / Self Care | Payer: Medicare PPO | Source: Ambulatory Visit | Attending: Internal Medicine | Admitting: Internal Medicine

## 2020-12-28 DIAGNOSIS — G35 Multiple sclerosis: Secondary | ICD-10-CM | POA: Insufficient documentation

## 2020-12-28 MED ORDER — DIPHENHYDRAMINE HCL 50 MG/ML IJ SOLN
25.0000 mg | Freq: Once | INTRAMUSCULAR | Status: AC
  Administered 2020-12-28: 25 mg via INTRAVENOUS
  Filled 2020-12-28: qty 1

## 2020-12-28 MED ORDER — ACETAMINOPHEN 325 MG PO TABS
650.0000 mg | ORAL_TABLET | Freq: Once | ORAL | Status: AC
  Administered 2020-12-28: 650 mg via ORAL
  Filled 2020-12-28: qty 2

## 2020-12-28 MED ORDER — METHYLPREDNISOLONE SODIUM SUCC 125 MG IJ SOLR
125.0000 mg | Freq: Once | INTRAMUSCULAR | Status: AC
  Administered 2020-12-28: 125 mg via INTRAVENOUS
  Filled 2020-12-28: qty 2

## 2020-12-28 MED ORDER — SODIUM CHLORIDE 0.9 % IV SOLN
INTRAVENOUS | Status: DC | PRN
  Administered 2020-12-28: 250 mL via INTRAVENOUS

## 2020-12-28 MED ORDER — SODIUM CHLORIDE 0.9 % IV SOLN
600.0000 mg | Freq: Once | INTRAVENOUS | Status: AC
  Administered 2020-12-28: 600 mg via INTRAVENOUS
  Filled 2020-12-28: qty 20

## 2020-12-28 MED ORDER — DIPHENHYDRAMINE HCL 50 MG/ML IJ SOLN
25.0000 mg | Freq: Once | INTRAMUSCULAR | Status: AC | PRN
  Administered 2020-12-28: 25 mg via INTRAVENOUS
  Filled 2020-12-28: qty 1

## 2020-12-28 NOTE — Progress Notes (Signed)
PATIENT CARE CENTER NOTE    Diagnosis: Multiple Sclerosis G35     Provider: Trudie Buckler, MD     Procedure: Ocrevus 600 mg infusion      Note: Patient received Ocrevus infusion via PIV. Pre-medications (Tylenol, Benadryl, Solumedrol) given per order. Half-way through infusion, patient developed itchy rash on abdomen. Infusion paused and additional dose of IV Benadryl 25 mg given. Rash began to resolve, vital signs wnl, infusion restarted. Patient tolerated well with no additional reaction. Rash completley resolved. Observed patient for 1 hour post-infusion. Vital signs remained stable. Discharge instructions given. Patient alert, oriented and ambulatory at discharge.

## 2020-12-29 ENCOUNTER — Encounter: Payer: Self-pay | Admitting: Dermatology

## 2020-12-29 DIAGNOSIS — F419 Anxiety disorder, unspecified: Secondary | ICD-10-CM | POA: Diagnosis not present

## 2020-12-29 DIAGNOSIS — F429 Obsessive-compulsive disorder, unspecified: Secondary | ICD-10-CM | POA: Diagnosis not present

## 2020-12-29 DIAGNOSIS — F331 Major depressive disorder, recurrent, moderate: Secondary | ICD-10-CM | POA: Diagnosis not present

## 2020-12-29 NOTE — Progress Notes (Signed)
   Follow-Up Visit   Subjective  Barbara Singleton is a 70 y.o. female who presents for the following: Skin Problem (Dry itchy scalp x 1 month- otc psoriasis shampoo- no help).  Scalp itchy rash plus few spots to check  Location:  Duration:  Quality:  Associated Signs/Symptoms: Modifying Factors:  Severity:  Timing: Context:   Objective  Well appearing patient in no apparent distress; mood and affect are within normal limits. Mid Parietal Scalp Patchy scale scalp, psoriasis versus seborrheic dermatitis.  No change in elbows, knees, and years, nails.  No involvement on face.  Right Forearm - Anterior Scattered ecchymoses.  No history of abnormal bleeding.    A focused examination was performed including head, neck, and arms.. Relevant physical exam findings are noted in the Assessment and Plan.   Assessment & Plan    Psoriasis Mid Parietal Scalp  Clobetasol foam (if price reasonable) daily for 3 weeks; taper use if improved.  Using on face and body folds.   clobetasol (OLUX) 0.05 % topical foam - Mid Parietal Scalp Apply topically 2 (two) times daily.  Solar purpura (HCC) Right Forearm - Anterior  Otc Dermend daily after bathing for 3 to 4 weeks trial.        I, Janalyn Harder, MD, have reviewed all documentation for this visit.  The documentation on 12/29/20 for the exam, diagnosis, procedures, and orders are all accurate and complete.

## 2021-01-01 DIAGNOSIS — H2513 Age-related nuclear cataract, bilateral: Secondary | ICD-10-CM | POA: Diagnosis not present

## 2021-01-01 DIAGNOSIS — H40013 Open angle with borderline findings, low risk, bilateral: Secondary | ICD-10-CM | POA: Diagnosis not present

## 2021-01-01 DIAGNOSIS — H04123 Dry eye syndrome of bilateral lacrimal glands: Secondary | ICD-10-CM | POA: Diagnosis not present

## 2021-01-02 DIAGNOSIS — F988 Other specified behavioral and emotional disorders with onset usually occurring in childhood and adolescence: Secondary | ICD-10-CM | POA: Diagnosis not present

## 2021-01-02 DIAGNOSIS — D849 Immunodeficiency, unspecified: Secondary | ICD-10-CM | POA: Diagnosis not present

## 2021-01-02 DIAGNOSIS — M791 Myalgia, unspecified site: Secondary | ICD-10-CM | POA: Diagnosis not present

## 2021-01-02 DIAGNOSIS — G35 Multiple sclerosis: Secondary | ICD-10-CM | POA: Diagnosis not present

## 2021-01-02 DIAGNOSIS — D72819 Decreased white blood cell count, unspecified: Secondary | ICD-10-CM | POA: Diagnosis not present

## 2021-03-23 DIAGNOSIS — F419 Anxiety disorder, unspecified: Secondary | ICD-10-CM | POA: Diagnosis not present

## 2021-03-23 DIAGNOSIS — F331 Major depressive disorder, recurrent, moderate: Secondary | ICD-10-CM | POA: Diagnosis not present

## 2021-03-23 DIAGNOSIS — F429 Obsessive-compulsive disorder, unspecified: Secondary | ICD-10-CM | POA: Diagnosis not present

## 2021-04-10 DIAGNOSIS — M791 Myalgia, unspecified site: Secondary | ICD-10-CM | POA: Diagnosis not present

## 2021-04-10 DIAGNOSIS — G35 Multiple sclerosis: Secondary | ICD-10-CM | POA: Diagnosis not present

## 2021-04-10 DIAGNOSIS — D849 Immunodeficiency, unspecified: Secondary | ICD-10-CM | POA: Diagnosis not present

## 2021-04-10 DIAGNOSIS — F988 Other specified behavioral and emotional disorders with onset usually occurring in childhood and adolescence: Secondary | ICD-10-CM | POA: Diagnosis not present

## 2021-04-10 DIAGNOSIS — D72819 Decreased white blood cell count, unspecified: Secondary | ICD-10-CM | POA: Diagnosis not present

## 2021-04-19 DIAGNOSIS — H2513 Age-related nuclear cataract, bilateral: Secondary | ICD-10-CM | POA: Diagnosis not present

## 2021-04-19 DIAGNOSIS — H04123 Dry eye syndrome of bilateral lacrimal glands: Secondary | ICD-10-CM | POA: Diagnosis not present

## 2021-04-19 DIAGNOSIS — H40013 Open angle with borderline findings, low risk, bilateral: Secondary | ICD-10-CM | POA: Diagnosis not present

## 2021-06-20 DIAGNOSIS — F419 Anxiety disorder, unspecified: Secondary | ICD-10-CM | POA: Diagnosis not present

## 2021-06-20 DIAGNOSIS — F429 Obsessive-compulsive disorder, unspecified: Secondary | ICD-10-CM | POA: Diagnosis not present

## 2021-06-20 DIAGNOSIS — F331 Major depressive disorder, recurrent, moderate: Secondary | ICD-10-CM | POA: Diagnosis not present

## 2021-06-29 ENCOUNTER — Non-Acute Institutional Stay (HOSPITAL_COMMUNITY)
Admission: RE | Admit: 2021-06-29 | Discharge: 2021-06-29 | Disposition: A | Discharge status: Home / Self Care | Payer: Medicare PPO | Source: Ambulatory Visit | Attending: Internal Medicine | Admitting: Internal Medicine

## 2021-06-29 ENCOUNTER — Other Ambulatory Visit: Payer: Self-pay

## 2021-06-29 DIAGNOSIS — G35 Multiple sclerosis: Secondary | ICD-10-CM | POA: Insufficient documentation

## 2021-06-29 MED ORDER — DIPHENHYDRAMINE HCL 50 MG/ML IJ SOLN
25.0000 mg | Freq: Once | INTRAMUSCULAR | Status: AC
  Administered 2021-06-29: 25 mg via INTRAVENOUS
  Filled 2021-06-29: qty 1

## 2021-06-29 MED ORDER — SODIUM CHLORIDE 0.9 % IV SOLN
600.0000 mg | Freq: Once | INTRAVENOUS | Status: AC
  Administered 2021-06-29: 600 mg via INTRAVENOUS
  Filled 2021-06-29: qty 20

## 2021-06-29 MED ORDER — ACETAMINOPHEN 325 MG PO TABS
650.0000 mg | ORAL_TABLET | Freq: Once | ORAL | Status: AC
  Administered 2021-06-29: 650 mg via ORAL
  Filled 2021-06-29: qty 2

## 2021-06-29 MED ORDER — METHYLPREDNISOLONE SODIUM SUCC 125 MG IJ SOLR
125.0000 mg | Freq: Once | INTRAMUSCULAR | Status: AC
  Administered 2021-06-29: 125 mg via INTRAVENOUS
  Filled 2021-06-29: qty 2

## 2021-06-29 MED ORDER — SODIUM CHLORIDE 0.9 % IV SOLN: INTRAVENOUS | Status: DC | PRN

## 2021-06-29 NOTE — Progress Notes (Signed)
Patient received IV Ocrecus along with pre medications - PO Tylenol, IV Benadrl and IV Solumedrol as ordered by Trudie Buckler, MD Observed for at least 60 minutes post infusion.Tolerated well, vitals stable, declined printed discharge instructions. Alert, oriented and ambulatory at the time of discharge. Patient to schedule an appointment for the next infusion in 6 months time.

## 2021-07-02 ENCOUNTER — Encounter: Payer: Self-pay | Admitting: Adult Health

## 2021-07-02 ENCOUNTER — Ambulatory Visit: Payer: Medicare PPO | Admitting: Adult Health

## 2021-07-02 VITALS — BP 111/57 | HR 55 | Ht 63.0 in | Wt 153.0 lb

## 2021-07-02 DIAGNOSIS — I6381 Other cerebral infarction due to occlusion or stenosis of small artery: Secondary | ICD-10-CM | POA: Diagnosis not present

## 2021-07-02 NOTE — Progress Notes (Addendum)
[facility] [address] [place]. [place] [address] [phone]       STROKE FOLLOW UP NOTE  [name] Date of Birth:  24-Jan-1951 Medical Record Number:  [id number]   Reason for Referral: stroke follow up    SUBJECTIVE:   CHIEF COMPLAINT:  Chief Complaint  Patient presents with   Follow-up    Rm 3 alone here for 6 month f/u. Pt reports she has been doing well no concerns.     HPI:   Update 07/02/2021 JM: 71 year old female returns for stroke follow-up.  Overall stable without new or reoccurring stroke/TIA symptoms. Continued use of cane outdoors only - denies any recent falls.  Compliant on Plavix and atorvastatin without side effects. Also reports restarting aspirin which was advised by her MS neurologist (Dr. Leotis Shames [facility]). Blood pressure today 111/57.  Has f/u visit next week with PCP with plans on repeat lab work.  No new concerns at this time.    History provided for reference purposes only Initial visit 12/25/2020 JM: [name] is being seen for hospital follow-up unaccompanied.  She has been doing well since discharge without residual stroke deficits or new stroke/TIA symptoms.  Long-term use of cane for ambulation for known MS without any recent falls.  Completed 3 weeks DAPT and remains on Plavix alone as well as atorvastatin 40 mg daily tolerating without side effects.  Blood pressure today 101/69.  Does not routinely monitor at home.  No concerns at this time.  Stroke admission 11/14/2020 [name] is a 71 y.o. female with a PMHx of HLD, OA, depression, revision of posterior spinal fusion T10-S1 2018, and MS since 2006. Patient presented to [facility] ED on 11/14/2020 for right-sided ataxia and gait impairment present reviewed hospitalization pertinent progress notes, lab work and imaging.  Evaluated by Dr. 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  CTA head/neck negative LVO.  EF 55 to 60%.  LDL  90.  A1c 5.9.  Recommended DAPT for 3 weeks then Plavix alone as on aspirin PTA as well as increased home dose atorvastatin from 10 mg to 40 mg daily.  No prior stroke history.  PT/OT evaluations with no follow up recommended.     Pertinent imaging/labs  Code Stroke CT head 1. No acute finding. 2. Chronic small vessel ischemia in the cerebral white matter. CTA head & neck No LVO; Small vertebrobasilar system with bilateral fetal type PCAs,anatomic variant MRI  6 mm: focus of restricted diffusion and T2/FLAIR hyperintense signal abnormality within the medial left thalamus (series 3, image 29).There is no corresponding abnormal enhancement and this likely reflects an acute infarct. 2D Echo 55 to 60% LDL 90 HgbA1c 5.9     ROS:   14 system review of systems performed and negative with exception of no complaints  PMH:  Past Medical History:  Diagnosis Date   Arthritis    Osteoarthritis- knees.   Depression    Multiple sclerosis (HCC)    Neuromuscular disorder [facility])    Dr. Algis Downs [place],neurology- [place], neurology foolows last visit 3 montha ago.    PSH:  Past Surgical History:  Procedure Laterality Date   BACK SURGERY     fusions x2 lumbar.   BREAST EXCISIONAL BIOPSY Left    CESAREAN SECTION     DENTAL SURGERY     extractions   TONSILLECTOMY     TOTAL KNEE ARTHROPLASTY Right 07/18/2015   Procedure: TOTAL KNEE ARTHROPLASTY;  Surgeon:  Durene Romans, MD;  Location: [facility] ORS;  Service: Orthopedics;  Laterality: Right;   TUBAL LIGATION      Social History:  Social History   Socioeconomic History   Marital status: Married    Spouse name: Not on file   Number of children: Not on file   Years of education: Not on file   Highest education level: Not on file  Occupational History   Not on file  Tobacco Use   Smoking status: Former    Types: Cigarettes   Smokeless tobacco: Not on file   Tobacco comments:    social"-college years"  Vaping Use   Vaping Use:  Never used  Substance and Sexual Activity   Alcohol use: No   Drug use: No   Sexual activity: Not on file  Other Topics Concern   Not on file  Social History Narrative   Not on file   Social Determinants of Health   Financial Resource Strain: Not on file  Food Insecurity: Not on file  Transportation Needs: Not on file  Physical Activity: Not on file  Stress: Not on file  Social Connections: Not on file  Intimate Partner Violence: Not on file    Family History:  Family History  Problem Relation Age of Onset   CVA Mother    Brain cancer Father     Medications:   Current Outpatient Medications on File Prior to Visit  Medication Sig Dispense Refill   ASPIRIN 81 PO Take by mouth.     atorvastatin (LIPITOR) 40 MG tablet Take 1 tablet (40 mg total) by mouth daily. 30 tablet 0   Calcium Citrate-Vitamin D (CALCIUM + D PO) Take 1 tablet by mouth daily.     clobetasol (OLUX) 0.05 % topical foam Apply topically 2 (two) times daily. 50 g 6   clopidogrel (PLAVIX) 75 MG tablet Take 1 tablet (75 mg total) by mouth daily. 30 tablet 0   Cyanocobalamin (VITAMIN B 12 PO) Take 1 tablet by mouth daily.     fluvoxaMINE (LUVOX) 100 MG tablet Take 100 mg by mouth 3 (three) times daily.      lamoTRIgine (LAMICTAL) 100 MG tablet Take 100 mg by mouth 2 (two) times daily.      LATUDA 80 MG TABS tablet Take 80 mg by mouth daily.     LORazepam (ATIVAN) 0.5 MG tablet Take 0.5 mg by mouth daily as needed for anxiety.     methylphenidate (RITALIN) 20 MG tablet Take 20 mg by mouth daily.     Multiple Vitamin (MULTIVITAMIN WITH MINERALS) TABS tablet Take 1 tablet by mouth daily.     traZODone (DESYREL) 100 MG tablet Take 100 mg by mouth at bedtime.     vitamin C (ASCORBIC ACID) 500 MG tablet Take 500 mg by mouth daily.     No current facility-administered medications on file prior to visit.    Allergies:  No Known Allergies    OBJECTIVE:  Physical Exam  Vitals:   07/02/21 1017  BP: (!) 111/57   Pulse: (!) 55  Weight: 153 lb (69.4 kg)  Height: 5\' 3"  (1.6 m)    Body mass index is 27.1 kg/m. No results found.  General: well developed, well nourished, very pleasant elderly Caucasian female, seated, in no evident distress Head: head normocephalic and atraumatic.   Neck: supple with no carotid or supraclavicular bruits Cardiovascular: regular rate and rhythm, no murmurs Musculoskeletal: no deformity Skin:  no rash/petichiae Vascular:  Normal pulses all extremities  Neurologic Exam Mental Status: Awake and fully alert.  Fluent speech and language.  Oriented to place and time. Recent and remote memory intact. Attention span, concentration and fund of knowledge appropriate. Mood and affect appropriate.  Cranial Nerves:  Pupils equal, briskly reactive to light. Extraocular movements full without nystagmus. Visual fields full to confrontation. Hearing intact. Facial sensation intact. Face, tongue, palate moves normally and symmetrically.  Motor: Normal bulk and tone. Normal strength in all tested extremity muscles Sensory.: intact to touch , pinprick , position and vibratory sensation.  Coordination: Rapid alternating movements normal in all extremities. Finger-to-nose and heel-to-shin performed accurately bilaterally. Gait and Station: Arises from chair without difficulty. Stance is slightly hunched. Gait demonstrates normal stride length and mild imbalance with use of cane. Tandem walk and heel toe not attempted.  Reflexes: 1+ and symmetric. Toes downgoing.         ASSESSMENT: AMEE BOOTHE is a 71 y.o. year old female with recent left thalamic infarct secondary to small vessel disease on 11/14/2020 after presenting with right-sided ataxia and gait impairment.  Vascular risk factors include MS since 2006 (followed by Dr. Haywood Pao), HLD and advanced age.     PLAN:  Left thalamic stroke :  Recovered well without residual deficit.   Continue clopidogrel 75 mg daily  and  atorvastatin 40 mg daily for secondary stroke prevention.  Advised no indication for DAPT as prolonged DAPT will not decrease risk of stroke and has been found to increase risk of bleed Discussed secondary stroke prevention measures and importance of close PCP follow up for aggressive stroke risk factor management including HLD with LDL goal<70.  I have gone over the pathophysiology of stroke, warning signs and symptoms, risk factors and their management in some detail with instructions to go to the closest emergency room for symptoms of concern.     Doing well from stroke standpoint and risk factors are managed by PCP. She may follow up PRN, as usual for our patients who are strictly being followed for stroke. If any new neurological issues should arise, request PCP place referral for evaluation by one of our neurologists. Thank you.     CC:  PCP: Blair Heys, MD   Silver Huguenin, MD [facility])   I spent 31 minutes of face-to-face and non-face-to-face time with patient.  This included previsit chart review, lab review, study review, electronic health record documentation, patient education regarding prior stroke including etiology, secondary stroke prevention measures and importance of managing stroke risk factors and answered all other questions to patient satisfaction  Ihor Austin, [facility]  [facility] [address] [place], [place] [address]  Phone [phone] Fax [phone] Note: This document was prepared with digital dictation and possible smart phrase technology. Any transcriptional errors that result from this process are unintentional.

## 2021-07-02 NOTE — Addendum Note (Signed)
Addended by: Ihor Austin L on: 07/02/2021 10:33 AM   Modules accepted: Orders

## 2021-07-02 NOTE — Patient Instructions (Addendum)
Continue clopidogrel 75 mg daily  and atorvastatin for secondary stroke prevention Please ensure you stop aspirin as both use of aspirin and plavix is not needed for stroke prevention   Continue to follow up with PCP regarding cholesterol and blood pressure management  Maintain strict control of hypertension with blood pressure goal below 130/90 and cholesterol with LDL cholesterol (bad cholesterol) goal below 70 mg/dL.   Signs of a Stroke? Follow the BEFAST method:  Balance Watch for a sudden loss of balance, trouble with coordination or vertigo Eyes Is there a sudden loss of vision in one or both eyes? Or double vision?  Face: Ask the person to smile. Does one side of the face droop or is it numb?  Arms: Ask the person to raise both arms. Does one arm drift downward? Is there weakness or numbness of a leg? Speech: Ask the person to repeat a simple phrase. Does the speech sound slurred/strange? Is the person confused ? Time: If you observe any of these signs, call [phone].        Thank you for coming to see [place] at [facility]. I hope we have been able to provide you high quality care today.  You may receive a patient satisfaction survey over the next few weeks. We would appreciate your feedback and comments so that we may continue to improve ourselves and the health of our patients.

## 2021-07-13 ENCOUNTER — Other Ambulatory Visit: Payer: Self-pay | Admitting: Family Medicine

## 2021-07-13 DIAGNOSIS — M8580 Other specified disorders of bone density and structure, unspecified site: Secondary | ICD-10-CM

## 2021-07-13 DIAGNOSIS — M1990 Unspecified osteoarthritis, unspecified site: Secondary | ICD-10-CM | POA: Diagnosis not present

## 2021-07-13 DIAGNOSIS — Z1231 Encounter for screening mammogram for malignant neoplasm of breast: Secondary | ICD-10-CM

## 2021-07-13 DIAGNOSIS — F331 Major depressive disorder, recurrent, moderate: Secondary | ICD-10-CM | POA: Diagnosis not present

## 2021-07-13 DIAGNOSIS — Z8673 Personal history of transient ischemic attack (TIA), and cerebral infarction without residual deficits: Secondary | ICD-10-CM | POA: Diagnosis not present

## 2021-07-13 DIAGNOSIS — E7800 Pure hypercholesterolemia, unspecified: Secondary | ICD-10-CM | POA: Diagnosis not present

## 2021-07-13 DIAGNOSIS — F428 Other obsessive-compulsive disorder: Secondary | ICD-10-CM | POA: Diagnosis not present

## 2021-07-13 DIAGNOSIS — Z0000 Encounter for general adult medical examination without abnormal findings: Secondary | ICD-10-CM | POA: Diagnosis not present

## 2021-07-13 DIAGNOSIS — G35 Multiple sclerosis: Secondary | ICD-10-CM | POA: Diagnosis not present

## 2021-07-13 DIAGNOSIS — R7303 Prediabetes: Secondary | ICD-10-CM | POA: Diagnosis not present

## 2021-08-08 DIAGNOSIS — D849 Immunodeficiency, unspecified: Secondary | ICD-10-CM | POA: Diagnosis not present

## 2021-08-08 DIAGNOSIS — G35 Multiple sclerosis: Secondary | ICD-10-CM | POA: Diagnosis not present

## 2021-08-08 DIAGNOSIS — M791 Myalgia, unspecified site: Secondary | ICD-10-CM | POA: Diagnosis not present

## 2021-08-11 IMAGING — CT CT ANGIO HEAD
1 of 8 series · 14 of 47 positions shown · IV contrast (OMNI)
Comparison: Same day CT head and MRI.

CLINICAL DATA: Stroke/TIA

EXAM:
CT ANGIOGRAPHY HEAD AND NECK
TECHNIQUE: Multidetector CT imaging of the head and neck was performed using
the standard protocol during bolus administration of intravenous
contrast. Multiplanar CT image reconstructions and MIPs were
obtained to evaluate the vascular anatomy. Carotid stenosis
measurements (when applicable) are obtained utilizing NASCET
criteria, using the distal internal carotid diameter as the
denominator.
CONTRAST:  75mL OMNIPAQUE IOHEXOL 350 MG/ML SOLN

[Series 6: thin · axial · 0.46mm/px · z∈[-280,+2]mm · 14 of 651 slices shown]
[im 44/651  brain]
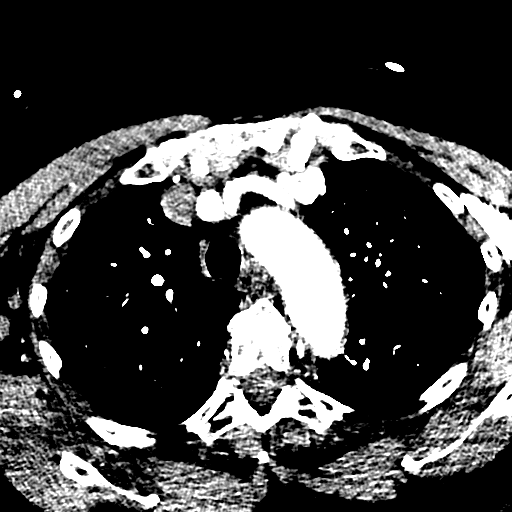
[im 87/651  bone]
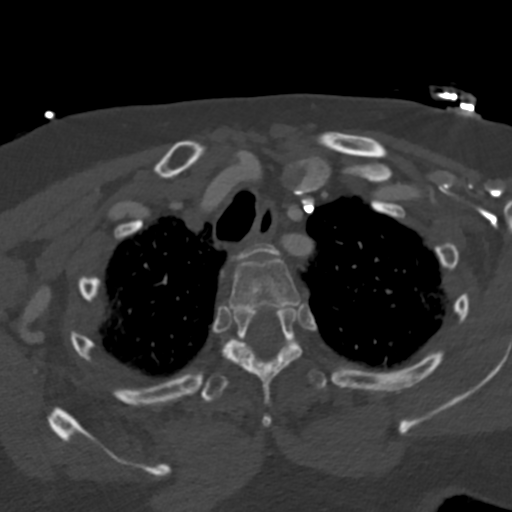
[im 131/651  brain]
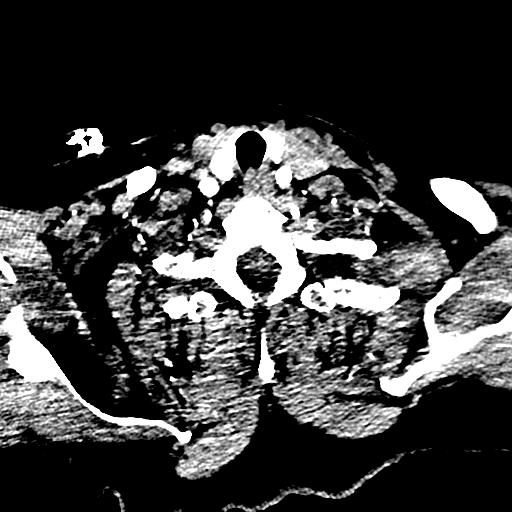
[im 174/651  bone]
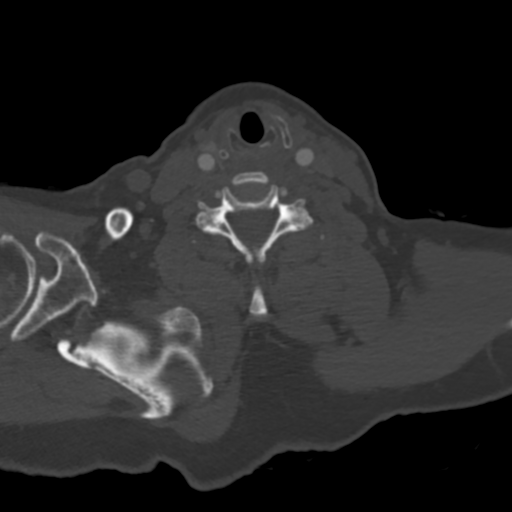
[im 217/651  brain]
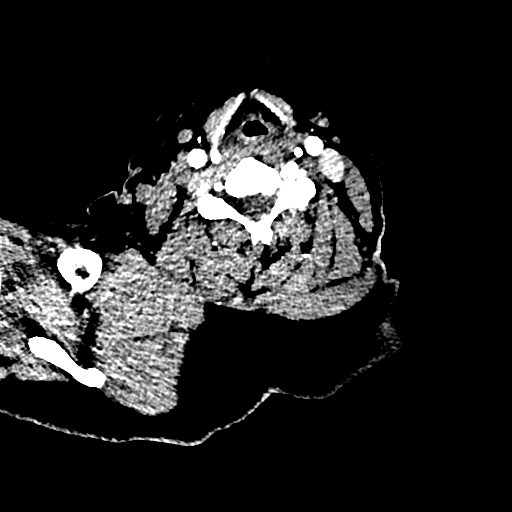
[im 261/651  bone]
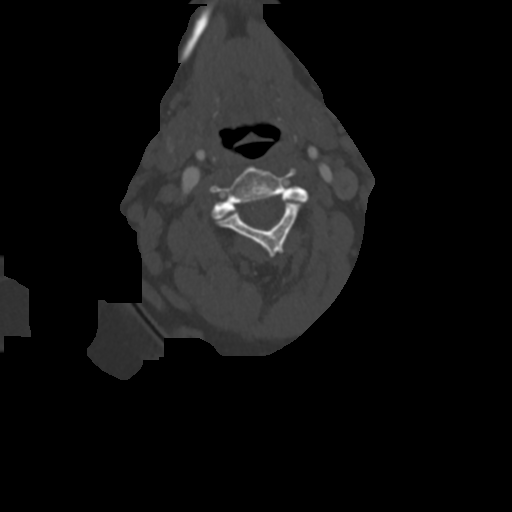
[im 304/651  brain]
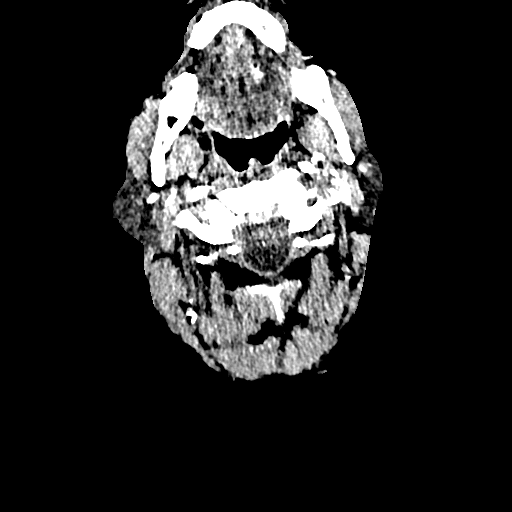
[im 347/651  bone]
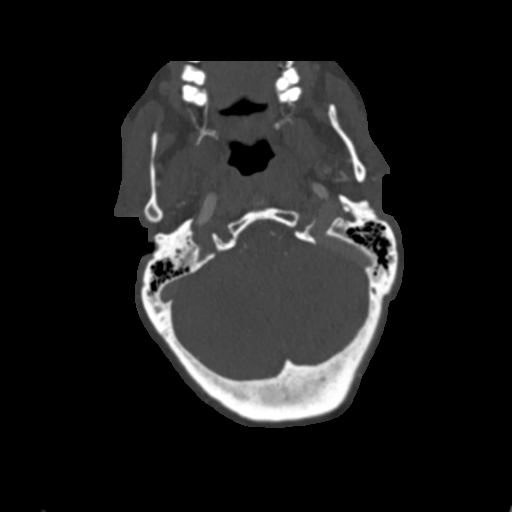
[im 391/651  brain]
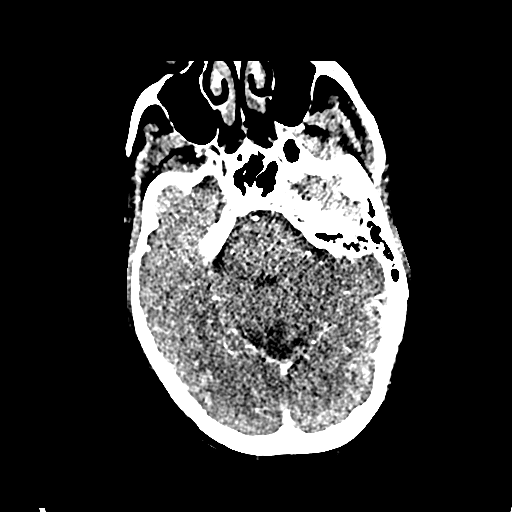
[im 434/651  bone]
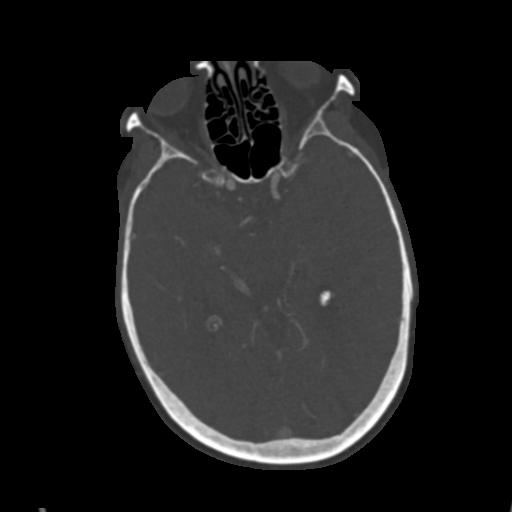
[im 477/651  brain]
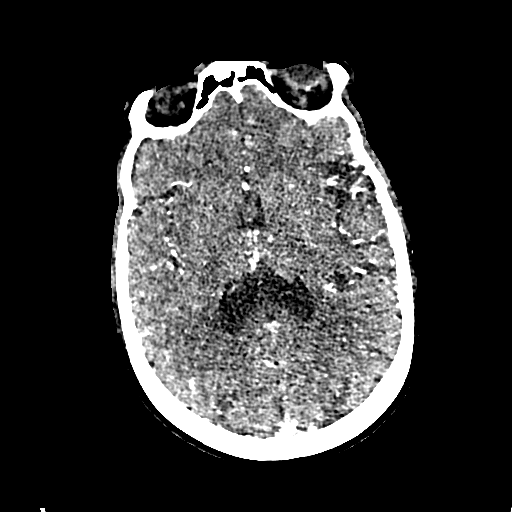
[im 521/651  bone]
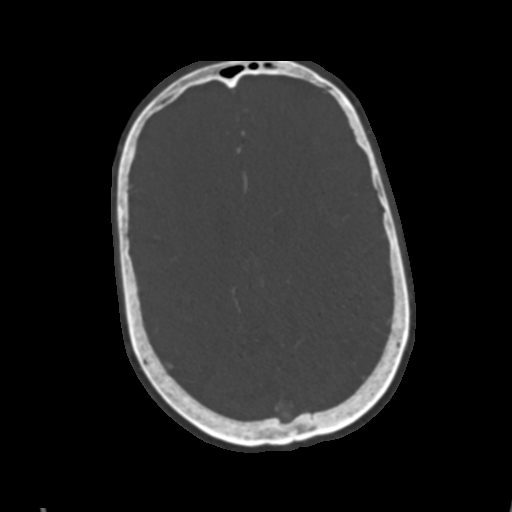
[im 564/651  brain]
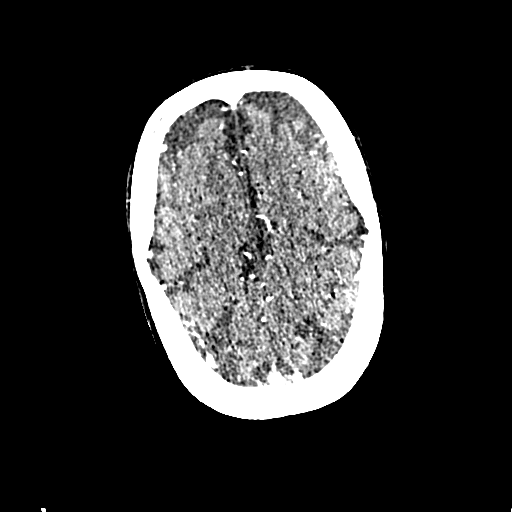
[im 607/651  bone]
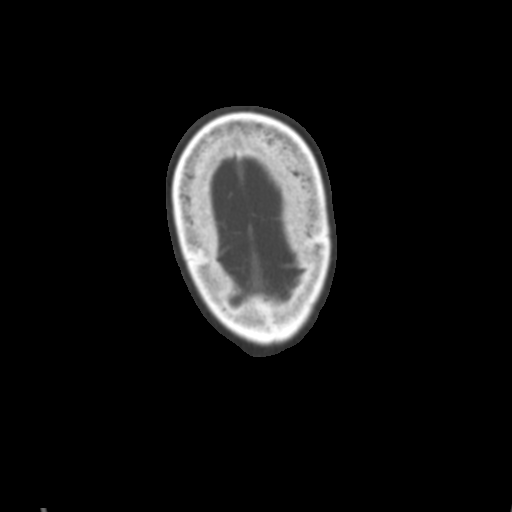

[14 of 47 positions shown; findings below may reference images not displayed]

FINDINGS: CTA NECK FINDINGS

Aortic arch: Great vessel origins are patent.

Right carotid system: No evidence of dissection, stenosis (50% or
greater) or occlusion.

Left carotid system: No evidence of dissection, stenosis (50% or
greater) or occlusion.

Vertebral arteries: Codominant. No evidence of dissection, stenosis
(50% or greater) or occlusion.

Skeleton: Slight retrolisthesis of C3 on C4. No evidence of acute
fracture on limited assessment. Multilevel facet arthropathy.

Other neck: No acute abnormality.

Upper chest: Mild peripheral predominant subpleural reticulation in
the lung apices bilaterally, potentially representing mild
fibrosis/scarring. No consolidation.

Review of the MIP images confirms the above findings

CTA HEAD FINDINGS

Anterior circulation: No large vessel occlusion or proximal flow
limiting stenosis. Hypoplastic left A1 ACA. No aneurysm identified.

Posterior circulation: Small vertebrobasilar system with bilateral
fetal type PCAs, anatomic variant. This includes small bilateral
vertebral arteries and a small basilar artery throughout. Bilateral
posterior cerebral arteries are patent without proximal flow
limiting stenosis. No aneurysm identified.

Venous sinuses: As permitted by contrast timing, patent.

Anatomic variants: As detailed above.

Review of the MIP images confirms the above findings
IMPRESSION: 1. No large vessel occlusion or proximal flow limiting stenosis in
the head or neck.
2. Small vertebrobasilar system with bilateral fetal type PCAs,
anatomic variant

## 2021-08-11 IMAGING — CT CT HEAD W/O CM
4 series · 16 of 47 positions shown, 18 images · non-contrast
Comparison: None.

CLINICAL DATA: Neuro deficit with acute stroke suspected.
Right-sided weakness and unsteady gait.

EXAM:
CT HEAD WITHOUT CONTRAST
TECHNIQUE: Contiguous axial images were obtained from the base of the skull
through the vertex without intravenous contrast.

[Series 3: head bone · axial · 0.45mm/px · z∈[-82,-50]mm · 3 of 81 slices shown]
[im 9/81  bone]
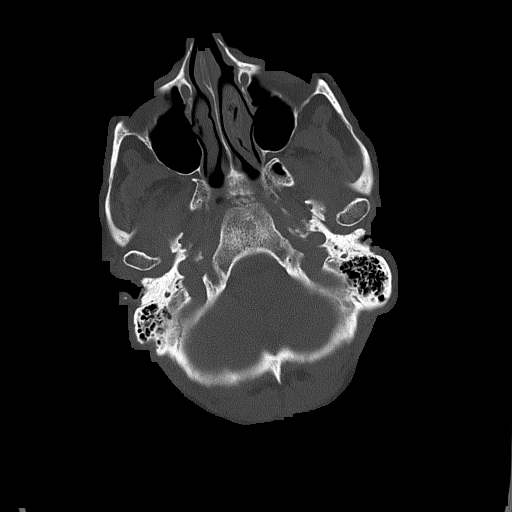
[im 17/81  bone]
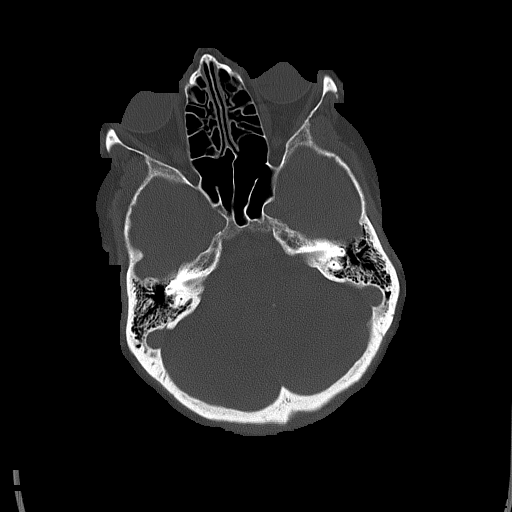
[im 25/81  bone]
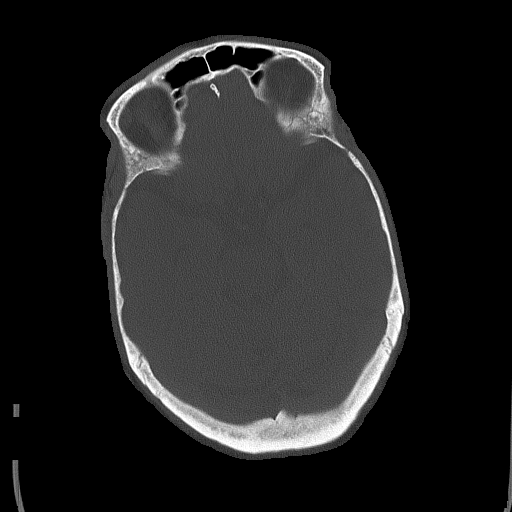

[Series 4: head without · axial · non-contrast · 0.45mm/px · z∈[-78,+42]mm · 7 of 33 slices shown, 9 images]
[im 5/33  brain]
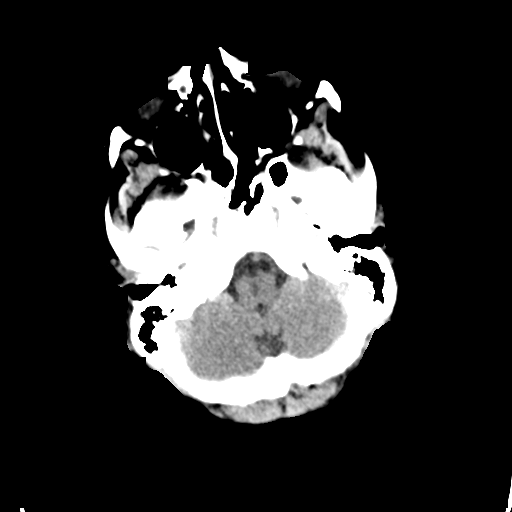
[im 5/33  bone]
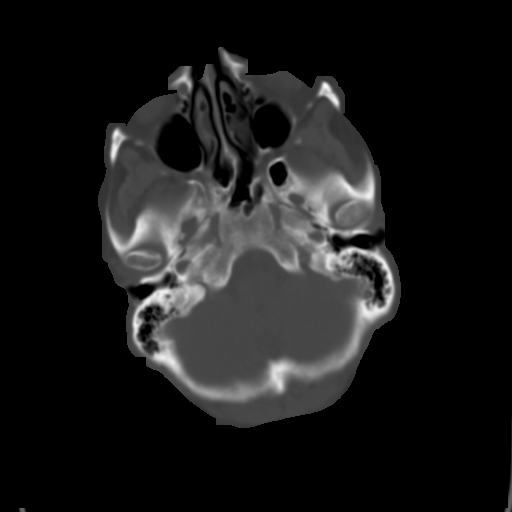
[im 9/33  brain]
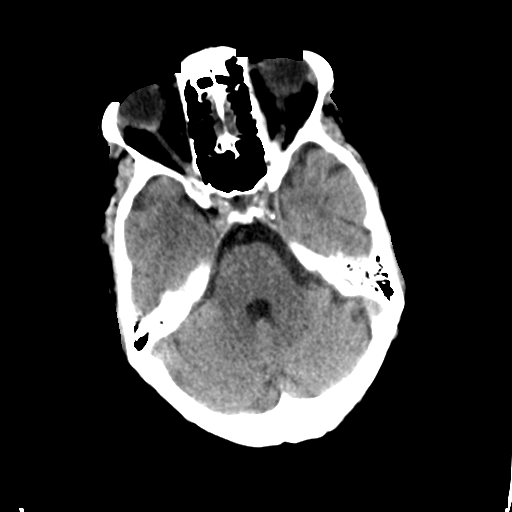
[im 13/33  brain]
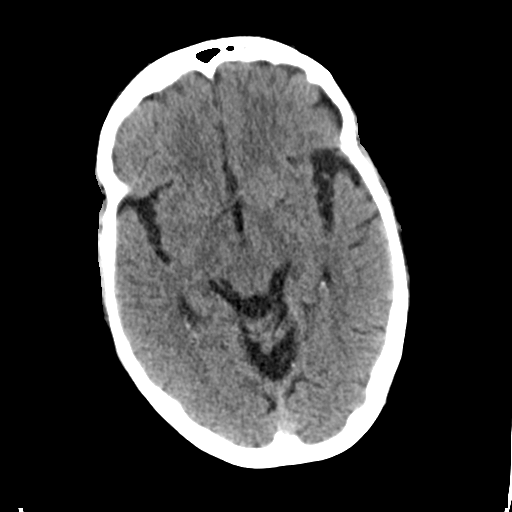
[im 17/33  brain]
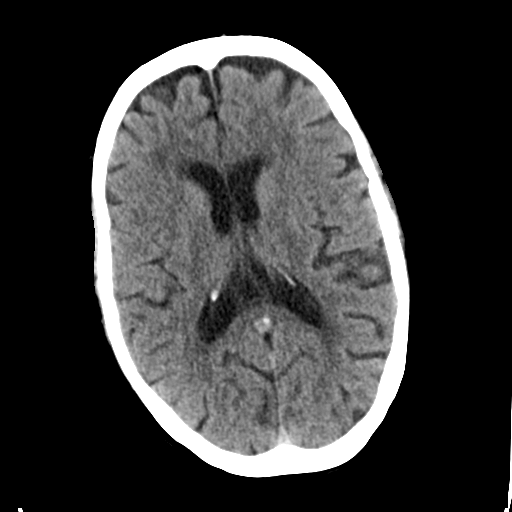
[im 21/33  brain]
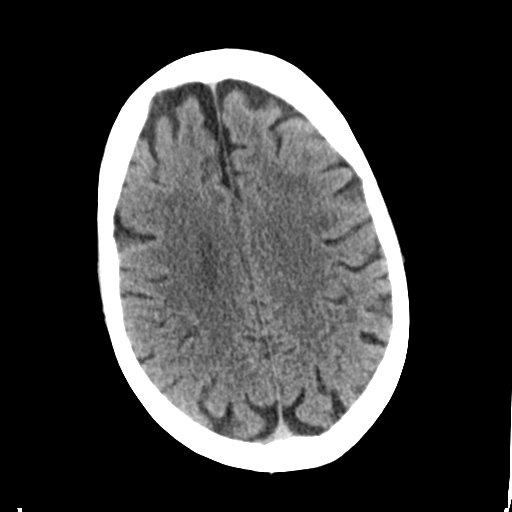
[im 21/33  bone]
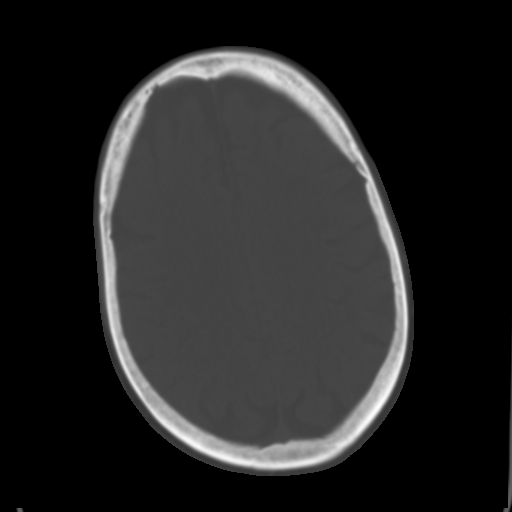
[im 25/33  brain]
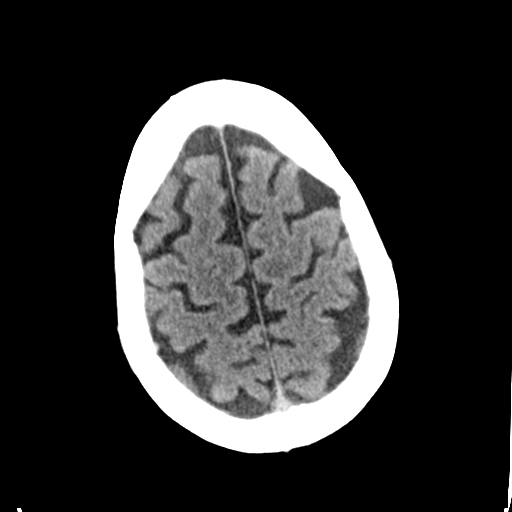
[im 29/33  brain]
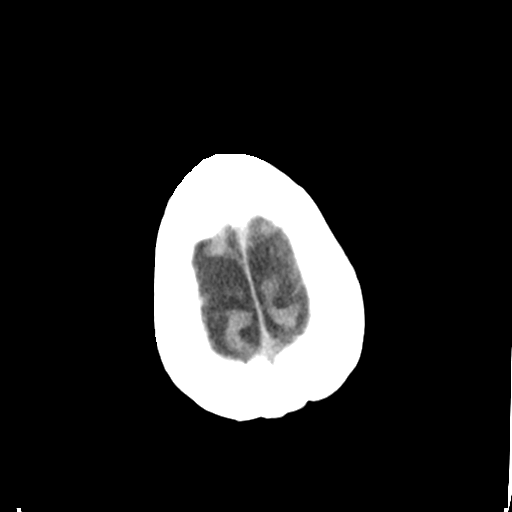

[Series 5: head without cor · coronal · non-contrast · 0.34mm/px · 3 of 71 slices shown]
[im 24/71  brain]
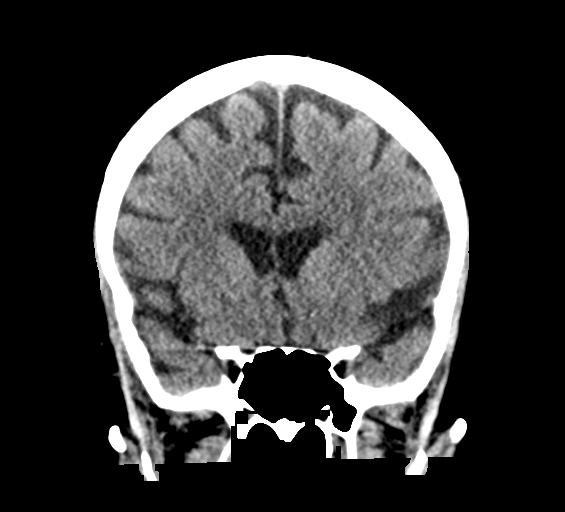
[im 32/71  brain]
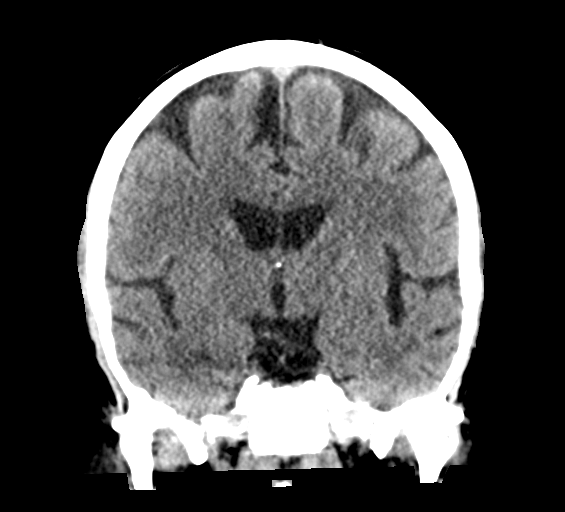
[im 39/71  brain]
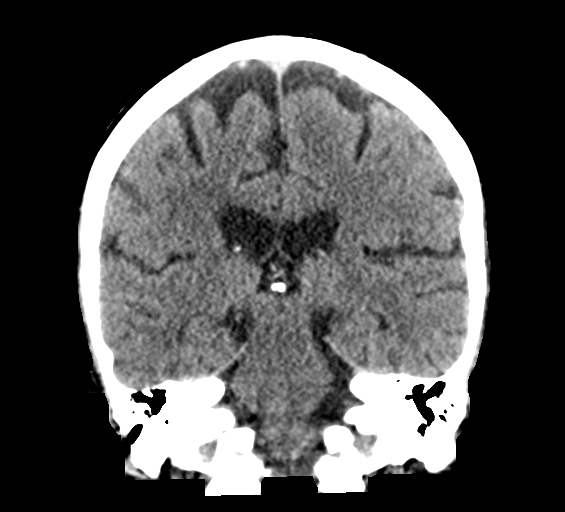

[Series 6: head without sag · sagittal · non-contrast · 0.35mm/px · 3 of 51 slices shown]
[im 17/51  brain]
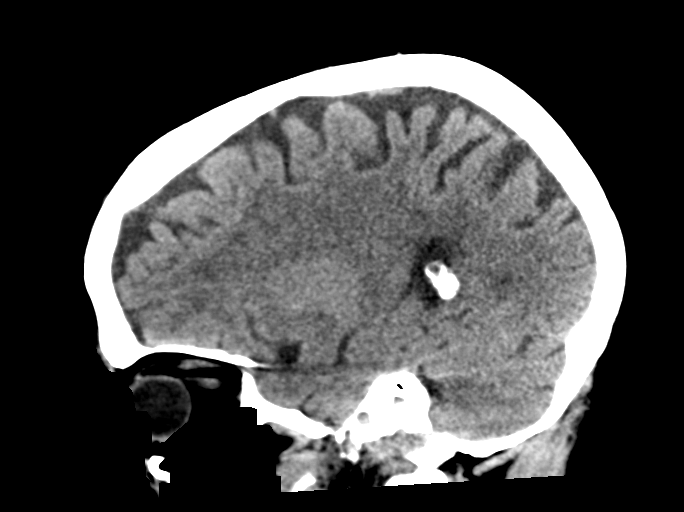
[im 26/51  brain]
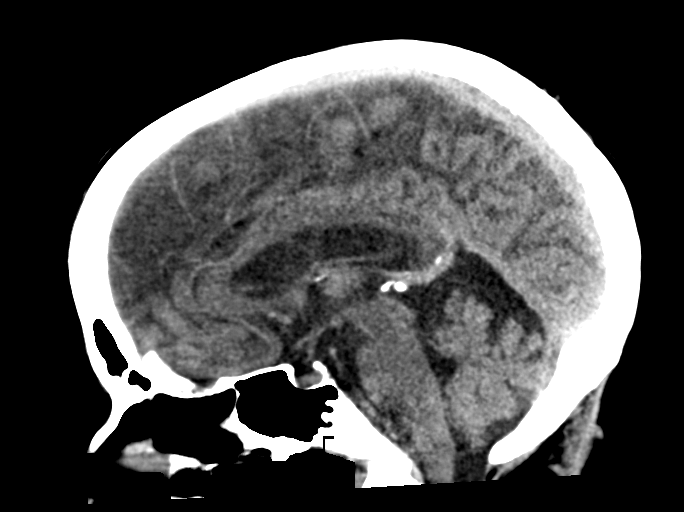
[im 34/51  brain]
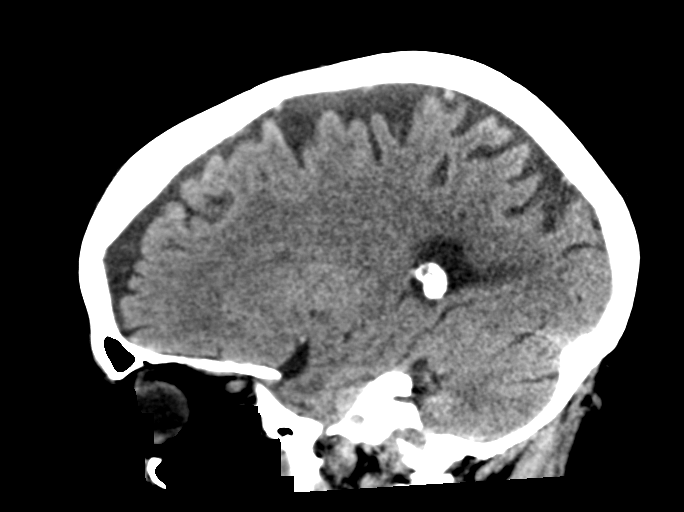

[16 of 47 positions shown; findings below may reference images not displayed]

FINDINGS: Brain: No evidence of acute cortical infarction, hemorrhage,
hydrocephalus, extra-axial collection or mass lesion/mass effect.
Notable streak artifact over the pons. Patchy low-density in the
cerebral white matter attributed to chronic small vessel ischemia.
Age normal brain volume.

Vascular: No hyperdense vessel or unexpected calcification.

Skull: Normal. Negative for fracture or focal lesion.

Sinuses/Orbits: No acute finding.
IMPRESSION: 1. No acute finding.
2. Chronic small vessel ischemia in the cerebral white matter.

## 2021-09-14 DIAGNOSIS — F429 Obsessive-compulsive disorder, unspecified: Secondary | ICD-10-CM | POA: Diagnosis not present

## 2021-09-14 DIAGNOSIS — F331 Major depressive disorder, recurrent, moderate: Secondary | ICD-10-CM | POA: Diagnosis not present

## 2021-09-14 DIAGNOSIS — F419 Anxiety disorder, unspecified: Secondary | ICD-10-CM | POA: Diagnosis not present

## 2021-10-22 NOTE — Progress Notes (Signed)
[facility]  PATIENT: Barbara Singleton DOB: Apr 18, 1951  REFERRING DOCTOR OR PCP: Mike Gip, DO [facility] neurology); Blair Heys MD (PCP) SOURCE: Patient, notes from [facility] neurology, imaging and lab reports, MRI images personally reviewed.  _________________________________   HISTORICAL  CHIEF COMPLAINT:  No chief complaint on file.   HISTORY OF PRESENT ILLNESS:  I had the pleasure seeing your patient, Barbara Singleton, at [facility] for neurologic consultation regarding her diagnosis of multiple sclerosis.  She is a 71 year old woman who was diagnosed with MS in 2001.   She is currently on Ocrevus, having switched around 2021 from Copaxone.  Last infusion was February 2023 at [facility]    Next one is scheduled in August.  Currently, she is reporting poor balance and she uses a cane to ambulate outside her house but not inside the house.    She can walk 2 miles or more with her cane.     She feels balance affects gait more than strength and legs feel strong and are not spastic.     She has right > left leg > arm numbness with tingling but not pain.    Her bladder function is stable with mild nocturia and frequency.     She feels she completely recovered from her stroke.   At the time she had diplopia and more right sided symptoms.    Vision is doing well.  No further diplopia.  She denies any major issues with her cognition.    She has some depression and is stable on medication.    She sleeps well most nights.   She has some fatigue but is fine overall.   She is on Ritalin which has helped her physical and cognitive fatigue.Marland Kitchen     History of MS: She was diagnosed with MS in 2001 after presenting with numbness in her arms, weakness in legs and multiple falls.  She reports having an MRI but is unsure if just the brain was done on the brain or spine was performed.  She also had a lumbar puncture but we do not have those results.   At the time she was seeing Dr. Leotis Shames.     She was initially placed on Copaxone and stayed on it until 2021.   At that time, she was felt to have more lesions on her MRI and was started on Ocrevus.   She was not having any exacerbations or major change in disability.      History of stroke: She presented to the emergency room 11/14/2020 for right-sided ataxia and gait disturbance.  At that time, she was on aspirin.  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  CT angiogram of the head and neck was negative.  Ejection fraction was 55 to 60%.  LDL was 90.  She was placed on DAPT (Plavix plus aspirin) and then converted to Plavix alone after several weeks.  Her atorvastatin dose was increased from 10 mg to 40 mg.  She recovered almost completely while on the hospital and was back to baseline a few days later.     Imaging: MRI of the head 11/14/2020 shows restricted diffusion consistent with an acute left medial thalamic infarction.  Additionally, scattered throughout the hemispheres on T2/FLAIR hyperintense foci in the periventricular, subcortical and deep white matter.  These are fairly nonspecific and could represent demyelination and/or chronic microvascular ischemic change.   I compared the MRI from 2022 to  the MRI from 02/15/2008.  There are more white matter changes during that 13-year interim though the pattern is nonspecific and many of the changes could be due to chronic microvascular ischemic disease  CT angiogram of the head and neck showed no stenosis.  Incidental note is made of a small vertebrobasilar system with fetal origins of both posterior cerebral arteries.  This is normal variant.  REVIEW OF SYSTEMS: Constitutional: No fevers, chills, sweats, or change in appetite Eyes: No visual changes, double vision, eye pain Ear, nose and throat: No hearing loss, ear pain, nasal congestion, sore throat Cardiovascular: No chest pain, palpitations Respiratory:   No shortness of breath at rest or with exertion.   No wheezes GastrointestinaI: No nausea, vomiting, diarrhea, abdominal pain, fecal incontinence Genitourinary:  No dysuria, urinary retention or frequency.  No nocturia. Musculoskeletal:  No neck pain, back pain Integumentary: No rash, pruritus, skin lesions Neurological: as above Psychiatric: No depression at this time.  No anxiety Endocrine: No palpitations, diaphoresis, change in appetite, change in weigh or increased thirst Hematologic/Lymphatic:  No anemia, purpura, petechiae. Allergic/Immunologic: No itchy/runny eyes, nasal congestion, recent allergic reactions, rashes  ALLERGIES: No Known Allergies  HOME MEDICATIONS:  Current Outpatient Medications:    atorvastatin (LIPITOR) 40 MG tablet, Take 1 tablet (40 mg total) by mouth daily., Disp: 30 tablet, Rfl: 0   Calcium Citrate-Vitamin D (CALCIUM + D PO), Take 1 tablet by mouth daily., Disp: , Rfl:    clobetasol (OLUX) 0.05 % topical foam, Apply topically 2 (two) times daily., Disp: 50 g, Rfl: 6   clopidogrel (PLAVIX) 75 MG tablet, Take 1 tablet (75 mg total) by mouth daily., Disp: 30 tablet, Rfl: 0   Cyanocobalamin (VITAMIN B 12 PO), Take 1 tablet by mouth daily., Disp: , Rfl:    fluvoxaMINE (LUVOX) 100 MG tablet, Take 100 mg by mouth 3 (three) times daily. , Disp: , Rfl:    lamoTRIgine (LAMICTAL) 100 MG tablet, Take 100 mg by mouth 2 (two) times daily. , Disp: , Rfl:    LATUDA 80 MG TABS tablet, Take 80 mg by mouth daily., Disp: , Rfl:    LORazepam (ATIVAN) 0.5 MG tablet, Take 0.5 mg by mouth daily as needed for anxiety., Disp: , Rfl:    methylphenidate (RITALIN) 20 MG tablet, Take 20 mg by mouth daily., Disp: , Rfl:    Multiple Vitamin (MULTIVITAMIN WITH MINERALS) TABS tablet, Take 1 tablet by mouth daily., Disp: , Rfl:    traZODone (DESYREL) 100 MG tablet, Take 100 mg by mouth at bedtime., Disp: , Rfl:    vitamin C (ASCORBIC ACID) 500 MG tablet, Take 500 mg by mouth daily., Disp: ,  Rfl:   PAST MEDICAL HISTORY: Past Medical History:  Diagnosis Date   Arthritis    Osteoarthritis- knees.   Depression    Multiple sclerosis (HCC)    Neuromuscular disorder [facility])    Dr. Algis Downs Jeffrey,neurology- [place], neurology foolows last visit 3 montha ago.    PAST SURGICAL HISTORY: Past Surgical History:  Procedure Laterality Date   BACK SURGERY     fusions x2 lumbar.   BREAST EXCISIONAL BIOPSY Left    CESAREAN SECTION     DENTAL SURGERY     extractions   TONSILLECTOMY     TOTAL KNEE ARTHROPLASTY Right 07/18/2015   Procedure: TOTAL KNEE ARTHROPLASTY;  Surgeon: Durene Romans, MD;  Location: [facility];  Service: Orthopedics;  Laterality: Right;   TUBAL LIGATION      FAMILY HISTORY: Family History  Problem Relation Age of Onset   CVA Mother    Brain cancer Father     SOCIAL HISTORY:  Social History   Socioeconomic History   Marital status: Married    Spouse name: Not on file   Number of children: Not on file   Years of education: Not on file   Highest education level: Not on file  Occupational History   Not on file  Tobacco Use   Smoking status: Former    Types: Cigarettes   Smokeless tobacco: Not on file   Tobacco comments:    social"-college years"  Vaping Use   Vaping Use: Never used  Substance and Sexual Activity   Alcohol use: No   Drug use: No   Sexual activity: Not on file  Other Topics Concern   Not on file  Social History Narrative   Not on file   Social Determinants of Health   Financial Resource Strain: Not on file  Food Insecurity: Not on file  Transportation Needs: Not on file  Physical Activity: Not on file  Stress: Not on file  Social Connections: Not on file  Intimate Partner Violence: Not on file     PHYSICAL EXAM  There were no vitals filed for this visit.  There is no height or weight on file to calculate BMI.   General: The patient is well-developed and well-nourished and in no acute distress  HEENT:   Head is [place]/AT.  Sclera are anicteric.  Funduscopic exam shows normal optic discs and retinal vessels.  Neck: No carotid bruits are noted.  The neck is nontender.  Cardiovascular: The heart has a regular rate and rhythm with a normal S1 and S2. There were no murmurs, gallops or rubs.    Skin: Extremities are without rash or  edema.  Musculoskeletal:  Back is nontender  Neurologic Exam  Mental status: The patient is alert and oriented x 3 at the time of the examination. The patient has apparent normal recent and remote memory, with an apparently normal attention span and concentration ability.   Speech is normal.  Cranial nerves: Extraocular movements are full. Pupils are equal, round, and reactive to light and accomodation.  Visual fields are full.  Facial symmetry is present. There is good facial sensation to soft touch bilaterally.Facial strength is normal.  Trapezius and sternocleidomastoid strength is normal. No dysarthria is noted.  The tongue is midline, and the patient has symmetric elevation of the soft palate. No obvious hearing deficits are noted.  Motor:  Muscle bulk is normal.   Tone is normal. Strength is  5 / 5 in all 4 extremities.   Sensory: Sensory testing is intact to pinprick, soft touch and vibration sensation in all 4 extremities.  Coordination: Cerebellar testing reveals good finger-nose-finger and heel-to-shin bilaterally.  Gait and station: Station is normal.   Gait is normal. Tandem gait is normal. Romberg is negative.   Reflexes: Deep tendon reflexes are symmetric and normal bilaterally.   Plantar responses are flexor.    DIAGNOSTIC DATA (LABS, IMAGING, TESTING) - I reviewed patient records, labs, notes, testing and imaging myself where available.  Lab Results  Component Value Date   WBC 4.1 11/14/2020   HGB 13.9 11/14/2020   HCT 41.0 11/14/2020   MCV 92.8 11/14/2020   PLT 227 11/14/2020      Component Value Date/Time   NA 141 11/14/2020 1001   K 4.4  11/14/2020 1001   CL 107 11/14/2020 1001   CO2 31 11/14/2020  0919   GLUCOSE 106 (H) 11/14/2020 1001   BUN 12 11/14/2020 1001   CREATININE 0.70 11/14/2020 1001   CALCIUM 9.4 11/14/2020 0919   PROT 6.4 (L) 11/14/2020 0919   ALBUMIN 3.8 11/14/2020 0919   AST 24 11/14/2020 0919   ALT 24 11/14/2020 0919   ALKPHOS 80 11/14/2020 0919   BILITOT 0.4 11/14/2020 0919   GFRNONAA >60 11/14/2020 0919   GFRAA >60 07/19/2015 0351   Lab Results  Component Value Date   CHOL 187 11/15/2020   HDL 75 11/15/2020   LDLCALC 90 11/15/2020   TRIG 109 11/15/2020   CHOLHDL 2.5 11/15/2020   Lab Results  Component Value Date   HGBA1C 5.9 (H) 11/15/2020   No results found for: "VITAMINB12" Lab Results  Component Value Date   TSH 2.546 11/15/2020       ASSESSMENT AND PLAN  Multiple sclerosis (HCC) - Plan: MR BRAIN WO CONTRAST, MR CERVICAL SPINE WO CONTRAST  History of arterial ischemic stroke - Plan: MR BRAIN WO CONTRAST  Gait disturbance - Plan: MR CERVICAL SPINE WO CONTRAST  In summary, Ms. Houston is a 71 year old woman who was diagnosed with MS in 2001.Marland Kitchen  She has had no further exacerbations since the original 1 leading to diagnosis.  As the MRI of the brain in 2021 showed additional or larger foci compared to 2009, she was switched from Copaxone to Ocrevus.  Of note, she had not noted any clinical changes over the preceding years.  Additionally, she had a stroke in 2021.  It is certainly possible that the changes on the MRI between 2009 and 2021 are more due to age and chronic microvascular ischemic change rather than MS.    We discussed 3 options for treatment.  She could continue on the Ocrevus.  We did discuss that it has been associated with a higher risk of pneumonia and other infections and possibility of higher risk of breast cancer.  She had clinically been stable on Copaxone and she could return to that medication.  The third option would be to stop disease modifying therapies.  The  disease modifying therapies are much more effective against the inflammatory aspects of MS rather than the degenerative aspects and at age 89 and having MS for more than 20 years without any exacerbations, it is probable that the extent of inflammation is minimal.  Therefore, we could consider stopping the medication and checking annual MRIs for several years to determine if there is activity.  If that occurs she can always resume her treatment.  She is most interested in this third option.  We will check an MRI of the brain and compared with her previous MRI to help [place] make a decision.  She notes a benefit with her fatigue taking Ritalin and this will be renewed.  She will return to see me in 6 months or sooner if there are new or worsening neurologic symptoms.  Amit Meloy A. Epimenio Foot, MD, [place] 10/22/2021, 8:21 PM Certified in Neurology, Clinical Neurophysiology, Sleep Medicine and Neuroimaging  [facility] [address], [address] [place], [place] [address] [phone]

## 2021-10-23 ENCOUNTER — Telehealth: Payer: Self-pay | Admitting: Neurology

## 2021-10-23 ENCOUNTER — Ambulatory Visit: Payer: Medicare PPO | Admitting: Neurology

## 2021-10-23 ENCOUNTER — Encounter: Payer: Self-pay | Admitting: Neurology

## 2021-10-23 VITALS — BP 117/75 | HR 65 | Ht 63.0 in | Wt 147.0 lb

## 2021-10-23 DIAGNOSIS — G35 Multiple sclerosis: Secondary | ICD-10-CM

## 2021-10-23 DIAGNOSIS — R269 Unspecified abnormalities of gait and mobility: Secondary | ICD-10-CM

## 2021-10-23 DIAGNOSIS — Z8673 Personal history of transient ischemic attack (TIA), and cerebral infarction without residual deficits: Secondary | ICD-10-CM

## 2021-10-23 MED ORDER — METHYLPHENIDATE HCL 20 MG PO TABS: 20.0000 mg | ORAL_TABLET | Freq: Two times a day (BID) | ORAL | 0 refills | Status: DC

## 2021-10-23 NOTE — Telephone Encounter (Signed)
[employer] medicare Auth: [id number] exp. 10/23/21-11/22/21 sent to GI

## 2021-11-04 ENCOUNTER — Ambulatory Visit
Admission: RE | Admit: 2021-11-04 | Discharge: 2021-11-04 | Disposition: A | Discharge status: Home / Self Care | Payer: Medicare PPO | Source: Ambulatory Visit | Attending: Neurology | Admitting: Neurology

## 2021-11-04 DIAGNOSIS — Z8673 Personal history of transient ischemic attack (TIA), and cerebral infarction without residual deficits: Secondary | ICD-10-CM

## 2021-11-04 DIAGNOSIS — R269 Unspecified abnormalities of gait and mobility: Secondary | ICD-10-CM | POA: Diagnosis not present

## 2021-11-04 DIAGNOSIS — G35 Multiple sclerosis: Secondary | ICD-10-CM

## 2021-11-06 ENCOUNTER — Telehealth: Payer: Self-pay | Admitting: Neurology

## 2021-11-26 ENCOUNTER — Ambulatory Visit
Admission: RE | Admit: 2021-11-26 | Discharge: 2021-11-26 | Disposition: A | Discharge status: Home / Self Care | Payer: Medicare PPO | Source: Ambulatory Visit | Attending: Family Medicine | Admitting: Family Medicine

## 2021-11-26 DIAGNOSIS — Z1231 Encounter for screening mammogram for malignant neoplasm of breast: Secondary | ICD-10-CM

## 2021-11-26 DIAGNOSIS — M8580 Other specified disorders of bone density and structure, unspecified site: Secondary | ICD-10-CM

## 2021-11-26 DIAGNOSIS — M8589 Other specified disorders of bone density and structure, multiple sites: Secondary | ICD-10-CM | POA: Diagnosis not present

## 2021-11-26 DIAGNOSIS — Z780 Asymptomatic menopausal state: Secondary | ICD-10-CM | POA: Diagnosis not present

## 2021-12-10 ENCOUNTER — Other Ambulatory Visit: Payer: Self-pay | Admitting: Neurology

## 2021-12-10 MED ORDER — METHYLPHENIDATE HCL 20 MG PO TABS: 20.0000 mg | ORAL_TABLET | Freq: Two times a day (BID) | ORAL | 0 refills | Status: DC

## 2021-12-10 NOTE — Telephone Encounter (Signed)
Pt is calling and requesting a refill onmethylphenidate (RITALIN) 20 MG tablet. Pt is requesting [facility] be [facility] to [facility] [phone]

## 2021-12-11 DIAGNOSIS — F429 Obsessive-compulsive disorder, unspecified: Secondary | ICD-10-CM | POA: Diagnosis not present

## 2021-12-11 DIAGNOSIS — F331 Major depressive disorder, recurrent, moderate: Secondary | ICD-10-CM | POA: Diagnosis not present

## 2021-12-11 DIAGNOSIS — F419 Anxiety disorder, unspecified: Secondary | ICD-10-CM | POA: Diagnosis not present

## 2021-12-27 ENCOUNTER — Encounter (HOSPITAL_COMMUNITY): Payer: Medicare PPO

## 2022-01-14 ENCOUNTER — Encounter: Payer: Self-pay | Admitting: Neurology

## 2022-01-15 ENCOUNTER — Other Ambulatory Visit: Payer: Self-pay | Admitting: *Deleted

## 2022-01-15 MED ORDER — METHYLPHENIDATE HCL 20 MG PO TABS: 20.0000 mg | ORAL_TABLET | Freq: Two times a day (BID) | ORAL | 0 refills | Status: DC

## 2022-02-11 ENCOUNTER — Encounter: Payer: Self-pay | Admitting: Neurology

## 2022-02-11 ENCOUNTER — Other Ambulatory Visit: Payer: Self-pay | Admitting: Neurology

## 2022-02-11 MED ORDER — METHYLPHENIDATE HCL 20 MG PO TABS: 20.0000 mg | ORAL_TABLET | Freq: Two times a day (BID) | ORAL | 0 refills | Status: DC

## 2022-03-11 DIAGNOSIS — F331 Major depressive disorder, recurrent, moderate: Secondary | ICD-10-CM | POA: Diagnosis not present

## 2022-03-11 DIAGNOSIS — F419 Anxiety disorder, unspecified: Secondary | ICD-10-CM | POA: Diagnosis not present

## 2022-03-11 DIAGNOSIS — F429 Obsessive-compulsive disorder, unspecified: Secondary | ICD-10-CM | POA: Diagnosis not present

## 2022-03-19 ENCOUNTER — Other Ambulatory Visit: Payer: Self-pay | Admitting: Neurology

## 2022-03-19 MED ORDER — METHYLPHENIDATE HCL 20 MG PO TABS: 20.0000 mg | ORAL_TABLET | Freq: Two times a day (BID) | ORAL | 0 refills | Status: DC

## 2022-03-28 DIAGNOSIS — H02055 Trichiasis without entropian left lower eyelid: Secondary | ICD-10-CM | POA: Diagnosis not present

## 2022-03-28 DIAGNOSIS — H40013 Open angle with borderline findings, low risk, bilateral: Secondary | ICD-10-CM | POA: Diagnosis not present

## 2022-03-28 DIAGNOSIS — H04123 Dry eye syndrome of bilateral lacrimal glands: Secondary | ICD-10-CM | POA: Diagnosis not present

## 2022-04-23 ENCOUNTER — Other Ambulatory Visit: Payer: Self-pay | Admitting: Neurology

## 2022-04-23 MED ORDER — METHYLPHENIDATE HCL 20 MG PO TABS: 20.0000 mg | ORAL_TABLET | Freq: Two times a day (BID) | ORAL | 0 refills | Status: DC

## 2022-04-29 ENCOUNTER — Encounter: Payer: Self-pay | Admitting: Neurology

## 2022-04-29 ENCOUNTER — Other Ambulatory Visit: Payer: Self-pay | Admitting: *Deleted

## 2022-04-29 MED ORDER — METHYLPHENIDATE HCL 20 MG PO TABS: 20.0000 mg | ORAL_TABLET | Freq: Every day | ORAL | 0 refills | Status: DC

## 2022-04-30 ENCOUNTER — Ambulatory Visit: Payer: Medicare PPO | Admitting: Neurology

## 2022-05-01 ENCOUNTER — Encounter: Payer: Self-pay | Admitting: Neurology

## 2022-05-01 ENCOUNTER — Ambulatory Visit: Payer: Medicare PPO | Admitting: Neurology

## 2022-05-01 VITALS — BP 114/68 | HR 63 | Ht 61.0 in | Wt 148.5 lb

## 2022-05-01 DIAGNOSIS — R41840 Attention and concentration deficit: Secondary | ICD-10-CM | POA: Diagnosis not present

## 2022-05-01 DIAGNOSIS — R269 Unspecified abnormalities of gait and mobility: Secondary | ICD-10-CM

## 2022-05-01 DIAGNOSIS — R351 Nocturia: Secondary | ICD-10-CM | POA: Diagnosis not present

## 2022-05-01 DIAGNOSIS — Z8673 Personal history of transient ischemic attack (TIA), and cerebral infarction without residual deficits: Secondary | ICD-10-CM | POA: Diagnosis not present

## 2022-05-01 DIAGNOSIS — G35 Multiple sclerosis: Secondary | ICD-10-CM | POA: Diagnosis not present

## 2022-05-01 DIAGNOSIS — I6381 Other cerebral infarction due to occlusion or stenosis of small artery: Secondary | ICD-10-CM

## 2022-05-01 MED ORDER — OXYBUTYNIN CHLORIDE 5 MG PO TABS: 5.0000 mg | ORAL_TABLET | Freq: Every evening | ORAL | 11 refills | Status: AC

## 2022-05-01 NOTE — Progress Notes (Signed)
[facility]  PATIENT: Barbara Singleton DOB: 09-11-1950  REFERRING DOCTOR OR PCP: Mike Gip, DO [facility] neurology); Blair Heys MD (PCP) SOURCE: Patient, notes from [facility] neurology, imaging and lab reports, MRI images personally reviewed.  _________________________________   HISTORICAL  CHIEF COMPLAINT:  Chief Complaint  Patient presents with   Follow-up    Pt in room #11 and alone. Pt here today to f/u with MS.    HISTORY OF PRESENT ILLNESS:  Barbara Singleton is a 71 y.o. woman with multiple sclerosis.  UPDATE 05/01/2022 She had been on Ocrevus (last infusion was January but at the last visit we had decided to see how she does off of a disease modifying therapy as the risks may be higher than the benefit.  She has been diagnosed for more than 20 years and has had no exacerbations and her last several MRIs showed no new lesions.   Gait is similar to last visit but she can walk 2 miles slowly with it.   Balance is reduced .  She uses a treadmill and goes 45 minutes    She feels balance affects gait more than strength and legs feel strong and are not spastic.     She has right > left leg > arm numbness with tingling but not pain.      Her bladder function is stable with mild nocturia and frequency.  She does better during the day.     She feels she completely recovered from her stroke.   At the time she had diplopia and more right sided symptoms.    Vision is doing well.  No further diplopia.  She denies any major issues with her cognition.    She has some depression and is stable on medication (on Luvox, lamotrigine, Latuda).    She sleeps well most nights (takes trazodone).   She has some fatigue but is fine overall.   The Ritalin continues to help her function better and feel more alert and less fatigued.      History of MS: She was diagnosed with MS in 2001 after presenting with numbness in her arms, weakness in legs and multiple falls.  She  reports having an MRI but is unsure if just the brain was done on the brain or spine was performed.  She also had a lumbar puncture but we do not have those results.  At the time she was seeing Dr. Leotis Shames.     She was initially placed on Copaxone and stayed on it until 2021.   At that time, she was felt to have more lesions on her MRI and was started on Ocrevus.   She was not having any exacerbations or major change in disability.      History of stroke: She presented to the emergency room 11/14/2020 for right-sided ataxia and gait disturbance.  At that time, she was on aspirin.  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  CT angiogram of the head and neck was negative.  Ejection fraction was 55 to 60%.  LDL was 90.  She was placed on DAPT (Plavix plus aspirin) and then converted to Plavix alone after several weeks.  Her atorvastatin dose was increased from 10 mg to 40 mg.  She recovered almost completely while on the hospital and was back to baseline a few days later.     DATA Imaging: MRI of the head 11/14/2020 shows restricted diffusion consistent with an acute  left medial thalamic infarction.  Additionally, scattered throughout the hemispheres on T2/FLAIR hyperintense foci in the periventricular, subcortical and deep white matter.  These are fairly nonspecific and could represent demyelination and/or chronic microvascular ischemic change.   I compared the MRI from 2022 to the MRI from 02/15/2008.  There are more white matter changes during that 13-year interim though the pattern is nonspecific and many of the changes could be due to chronic microvascular ischemic disease  MRI brain 11/04/2021 showed scattered T2/FLAIR hyperintense foci in the subcortical and deep white matter of the cerebral hemispheres. A few foci are in the periventricular white matter. None of the foci appears to be acute and there are no new lesions compared to the 2022 MRI. Although some foci  could be consistent with demyelination. The majority have an appearance more consistent with stable chronic microvascular ischemic change.  1 small focus in the medial left thalamus could be a small lacunar infarction.  MRI cervical spine 11/04/2021 showed that the spinal cord was  normal.  No demyelinating plaques noted.      At C3-C4, there are degenerative changes and very minimal retrolisthesis causing moderate right foraminal narrowing but no spinal stenosis or nerve root compression.   At C4-C5, there are degenerative changes causing borderline spinal stenosis and moderately severe left and moderate right foraminal narrowing.  There is potential for left C5 nerve root compression.   At C5-C6, there are degenerative changes causing minimal spinal stenosis and mild to moderate foraminal narrowing but no nerve root compression.  CT angiogram of the head and neck showed no stenosis.  Incidental note is made of a small vertebrobasilar system with fetal origins of both posterior cerebral arteries.  This is normal variant.  REVIEW OF SYSTEMS: Constitutional: No fevers, chills, sweats, or change in appetite Eyes: No visual changes, double vision, eye pain Ear, nose and throat: No hearing loss, ear pain, nasal congestion, sore throat Cardiovascular: No chest pain, palpitations Respiratory:  No shortness of breath at rest or with exertion.   No wheezes GastrointestinaI: No nausea, vomiting, diarrhea, abdominal pain, fecal incontinence Genitourinary:  No dysuria, urinary retention or frequency.  No nocturia. Musculoskeletal:  No neck pain, back pain Integumentary: No rash, pruritus, skin lesions Neurological: as above Psychiatric: No depression at this time.  No anxiety Endocrine: No palpitations, diaphoresis, change in appetite, change in weigh or increased thirst Hematologic/Lymphatic:  No anemia, purpura, petechiae. Allergic/Immunologic: No itchy/runny eyes, nasal congestion, recent allergic  reactions, rashes  ALLERGIES: Allergies  Allergen Reactions   Naproxen Other (See Comments)    HEADACHE     HOME MEDICATIONS:  Current Outpatient Medications:    atorvastatin (LIPITOR) 40 MG tablet, Take 1 tablet (40 mg total) by mouth daily., Disp: 30 tablet, Rfl: 0   Calcium Citrate-Vitamin D (CALCIUM + D PO), Take 1 tablet by mouth daily., Disp: , Rfl:    clobetasol (OLUX) 0.05 % topical foam, Apply topically 2 (two) times daily., Disp: 50 g, Rfl: 6   clopidogrel (PLAVIX) 75 MG tablet, Take 1 tablet (75 mg total) by mouth daily., Disp: 30 tablet, Rfl: 0   Cyanocobalamin (VITAMIN B 12 PO), Take 1 tablet by mouth daily., Disp: , Rfl:    fluvoxaMINE (LUVOX) 100 MG tablet, Take 100 mg by mouth 3 (three) times daily. , Disp: , Rfl:    lamoTRIgine (LAMICTAL) 100 MG tablet, Take 100 mg by mouth 2 (two) times daily. , Disp: , Rfl:    LATUDA 80 MG TABS tablet, Take  80 mg by mouth daily., Disp: , Rfl:    LORazepam (ATIVAN) 0.5 MG tablet, Take 0.5 mg by mouth daily as needed for anxiety., Disp: , Rfl:    methylphenidate (RITALIN) 20 MG tablet, Take 1 tablet (20 mg total) by mouth daily., Disp: 30 tablet, Rfl: 0   Multiple Vitamin (MULTIVITAMIN WITH MINERALS) TABS tablet, Take 1 tablet by mouth daily., Disp: , Rfl:    ocrelizumab (OCREVUS) 300 MG/10ML injection, Inject 600 mg into the vein See admin instructions. Every 6 months, Disp: , Rfl:    traZODone (DESYREL) 100 MG tablet, Take 100 mg by mouth at bedtime., Disp: , Rfl:    vitamin C (ASCORBIC ACID) 500 MG tablet, Take 500 mg by mouth daily., Disp: , Rfl:   PAST MEDICAL HISTORY: Past Medical History:  Diagnosis Date   Arthritis    Osteoarthritis- knees.   Depression    Multiple sclerosis (HCC)    Neuromuscular disorder [facility])    Dr. Algis Downs Jeffrey,neurology- [place], neurology foolows last visit 3 montha ago.    PAST SURGICAL HISTORY: Past Surgical History:  Procedure Laterality Date   BACK SURGERY     fusions x2  lumbar.   BREAST EXCISIONAL BIOPSY Left    CESAREAN SECTION     DENTAL SURGERY     extractions   TONSILLECTOMY     TOTAL KNEE ARTHROPLASTY Right 07/18/2015   Procedure: TOTAL KNEE ARTHROPLASTY;  Surgeon: Durene Romans, MD;  Location: [facility];  Service: Orthopedics;  Laterality: Right;   TUBAL LIGATION      FAMILY HISTORY: Family History  Problem Relation Age of Onset   CVA Mother    Brain cancer Father     SOCIAL HISTORY:  Social History   Socioeconomic History   Marital status: Married    Spouse name: Not on file   Number of children: Not on file   Years of education: Not on file   Highest education level: Not on file  Occupational History   Not on file  Tobacco Use   Smoking status: Former    Types: Cigarettes   Smokeless tobacco: Not on file   Tobacco comments:    social"-college years"  Vaping Use   Vaping Use: Never used  Substance and Sexual Activity   Alcohol use: No   Drug use: No   Sexual activity: Not on file  Other Topics Concern   Not on file  Social History Narrative   Not on file   Social Determinants of Health   Financial Resource Strain: Not on file  Food Insecurity: Not on file  Transportation Needs: Not on file  Physical Activity: Not on file  Stress: Not on file  Social Connections: Not on file  Intimate Partner Violence: Not on file     PHYSICAL EXAM  Vitals:   05/01/22 0844  BP: 114/68  Pulse: 63  Weight: 148 lb 8 oz (67.4 kg)  Height:  (1.549 m)    Body mass index is 28.06 kg/m.   General: The patient is well-developed and well-nourished and in no acute distress  HEENT:  Head is [place]/AT.  Sclera are anicteric.     Skin: Extremities are without rash or  edema.  Neurologic Exam  Mental status: The patient is alert and oriented x 3 at the time of the examination. The patient has apparent normal recent and remote memory, with an apparently normal attention span and concentration ability.   Speech is normal.  Cranial  nerves: Extraocular movements are  full.  Facial strength was normal.  No dysarthria.  No obvious hearing deficits are noted.  Motor:  Muscle bulk is normal.   Tone is normal. Strength is  5 / 5 in all 4 extremities.   Sensory: Sensory testing is intact to pinprick, soft touch and vibration sensation in all 4 extremities.  Coordination: Cerebellar testing reveals good finger-nose-finger and heel-to-shin bilaterally.  Gait and station: Station is normal.   She has an arthritic gait and is hunched over.  She uses her cane.  Tandem is poor.  Romberg is negative.   Reflexes: Deep tendon reflexes are symmetric and normal bilaterally.        DIAGNOSTIC DATA (LABS, IMAGING, TESTING) - I reviewed patient records, labs, notes, testing and imaging myself where available.  Lab Results  Component Value Date   WBC 4.1 11/14/2020   HGB 13.9 11/14/2020   HCT 41.0 11/14/2020   MCV 92.8 11/14/2020   PLT 227 11/14/2020      Component Value Date/Time   NA 141 11/14/2020 1001   K 4.4 11/14/2020 1001   CL 107 11/14/2020 1001   CO2 31 11/14/2020 0919   GLUCOSE 106 (H) 11/14/2020 1001   BUN 12 11/14/2020 1001   CREATININE 0.70 11/14/2020 1001   CALCIUM 9.4 11/14/2020 0919   PROT 6.4 (L) 11/14/2020 0919   ALBUMIN 3.8 11/14/2020 0919   AST 24 11/14/2020 0919   ALT 24 11/14/2020 0919   ALKPHOS 80 11/14/2020 0919   BILITOT 0.4 11/14/2020 0919   GFRNONAA >60 11/14/2020 0919   GFRAA >60 07/19/2015 0351   Lab Results  Component Value Date   CHOL 187 11/15/2020   HDL 75 11/15/2020   LDLCALC 90 11/15/2020   TRIG 109 11/15/2020   CHOLHDL 2.5 11/15/2020   Lab Results  Component Value Date   HGBA1C 5.9 (H) 11/15/2020   No results found for: "VITAMINB12" Lab Results  Component Value Date   TSH 2.546 11/15/2020       ASSESSMENT AND PLAN  Multiple sclerosis (HCC)  History of arterial ischemic stroke  Gait disturbance  Thalamic stroke (HCC)  Attention deficit  Nocturia  Due to  age, duration of MS and clinical and radiologic stability, we stopped the Ocrevus this year.  She is now 11 months out from her last dose.  She continues to do well.  Around the time of the next visit we will check an MRI of the brain to determine if there is any breakthrough activity.  If this is occurring we will reconsider starting a disease modifying therapy. Continue Ritalin. Add oxybutynin at night only for nocturia that is troublesome and affects sleep quality Rtc 6 months or sooner if new or worsening issues.  Dejanique Ruehl A. Epimenio Foot, MD, [facility] 05/01/2022, 9:28 AM Certified in Neurology, Clinical Neurophysiology, Sleep Medicine and Neuroimaging  [facility] [address] [place], [place] [address] [phone]

## 2022-05-28 ENCOUNTER — Other Ambulatory Visit: Payer: Self-pay | Admitting: *Deleted

## 2022-05-28 MED ORDER — METHYLPHENIDATE HCL 20 MG PO TABS: 20.0000 mg | ORAL_TABLET | Freq: Every day | ORAL | 0 refills | Status: DC

## 2022-05-28 NOTE — Telephone Encounter (Signed)
Checked drug registry. Last refilled 04/29/22 #30.  Last seen 05/01/22 and next f/u 10/30/21.

## 2022-05-30 DIAGNOSIS — F331 Major depressive disorder, recurrent, moderate: Secondary | ICD-10-CM | POA: Diagnosis not present

## 2022-05-30 DIAGNOSIS — F419 Anxiety disorder, unspecified: Secondary | ICD-10-CM | POA: Diagnosis not present

## 2022-05-30 DIAGNOSIS — F429 Obsessive-compulsive disorder, unspecified: Secondary | ICD-10-CM | POA: Diagnosis not present

## 2022-06-12 DIAGNOSIS — M25552 Pain in left hip: Secondary | ICD-10-CM | POA: Diagnosis not present

## 2022-06-12 DIAGNOSIS — M1612 Unilateral primary osteoarthritis, left hip: Secondary | ICD-10-CM | POA: Diagnosis not present

## 2022-06-27 ENCOUNTER — Other Ambulatory Visit: Payer: Self-pay | Admitting: Neurology

## 2022-06-27 MED ORDER — METHYLPHENIDATE HCL 20 MG PO TABS: 20.0000 mg | ORAL_TABLET | Freq: Every day | ORAL | 0 refills | Status: DC

## 2022-07-08 NOTE — Patient Instructions (Signed)
SURGICAL WAITING ROOM VISITATION Patients having surgery or a procedure may have no more than 2 support people in the waiting area - these visitors may rotate in the visitor waiting room.   Due to an increase in RSV and influenza rates and associated hospitalizations, children ages 40 and under may not visit patients in [place]. If the patient needs to stay at the hospital during part of their recovery, the visitor guidelines for inpatient rooms apply.  PRE-OP VISITATION  Pre-op nurse will coordinate an appropriate time for 1 support person to accompany the patient in pre-op.  This support person may not rotate.  This visitor will be contacted when the time is appropriate for the visitor to come back in the pre-op area.  Please refer to the [facility] website for the visitor guidelines for Inpatients (after your surgery is over and you are in a regular room).  You are not required to quarantine at this time prior to your surgery. However, you must do this: Hand Hygiene often Do NOT share personal items Notify your provider if you are in close contact with someone who has COVID or you develop fever 100.4 or greater, new onset of sneezing, cough, sore throat, shortness of breath or body aches.  If you test positive for Covid or have been in contact with anyone that has tested positive in the last 10 days please notify you surgeon.    Your procedure is scheduled on:  Tuesday  July 23, 2022  Report to [facility] Main Entrance: [place] entrance where the [employer] is available.   Report to admitting at:  09:00   AM  +++++Call this number if you have any questions or problems the morning of surgery [phone]  Do not eat food after Midnight the night prior to your surgery/procedure.  After Midnight you may have the following liquids until  08:30 AM  DAY OF SURGERY  Clear Liquid Diet Water Black Coffee (sugar ok, NO MILK/CREAM OR CREAMERS)  Tea (sugar ok, NO  MILK/CREAM OR CREAMERS) regular and decaf                             Plain Jell-O  with no fruit (NO RED)                                           Fruit ices (not with fruit pulp, NO RED)                                     Popsicles (NO RED)                                                                  Juice: apple, WHITE grape, WHITE cranberry Sports drinks like Gatorade or Powerade (NO RED)                   The day of surgery:  Drink ONE (1) Pre-Surgery Clear Ensure at  08:30 AM the morning of surgery. Drink in one sitting.  Do not sip.  This drink was given to you during your hospital pre-op appointment visit. Nothing else to drink after completing the Pre-Surgery Clear Ensure : No candy, chewing gum or throat lozenges.    FOLLOW  ANY ADDITIONAL PRE OP INSTRUCTIONS YOU RECEIVED FROM YOUR SURGEON'S OFFICE!!!   Oral Hygiene is also important to reduce your risk of infection.        Remember - BRUSH YOUR TEETH THE MORNING OF SURGERY WITH YOUR REGULAR TOOTHPASTE  Do NOT smoke after Midnight the night before surgery.  Take ONLY these medicines the morning of surgery with A SIP OF WATER: lamotrigine (Lamictal), fluxovanine (Luvox), Latuda.  You may take Tylenol if needed for pain                    You may not have any metal on your body including hair pins, jewelry, and body piercing  Do not wear make-up, lotions, powders, perfumes, or deodorant  Do not wear nail polish including gel and S&S, artificial / acrylic nails, or any other type of covering on natural nails including finger and toenails. If you have artificial nails, gel coating, etc., that needs to be removed by a nail salon, Please have this removed prior to surgery. Not doing so may mean that your surgery could be cancelled or delayed if the Surgeon or anesthesia staff feels like they are unable to monitor you safely.   Do not shave 48 hours prior to surgery to avoid nicks in your skin which may contribute to  postoperative infections.    You may bring a small overnight bag with you on the day of surgery, only pack items that are not valuable. [facility] IS NOT RESPONSIBLE   FOR VALUABLES THAT ARE LOST OR STOLEN.   Do not bring your home medications to the hospital. The Pharmacy will dispense medications listed on your medication list to you during your admission in the Hospital.  Special Instructions: Bring a copy of your healthcare power of attorney and living will documents the day of surgery, if you wish to have them scanned into your [facility] Records- EPIC  Please read over the following fact sheets you were given: IF YOU HAVE QUESTIONS ABOUT YOUR PRE-OP INSTRUCTIONS, PLEASE CALL [phone]  ([place])   [place] - Preparing for Surgery Before surgery, you can play an important role.  Because skin is not sterile, your skin needs to be as free of germs as possible.  You can reduce the number of germs on your skin by washing with CHG (chlorahexidine gluconate) soap before surgery.  CHG is an antiseptic cleaner which kills germs and bonds with the skin to continue killing germs even after washing. Please DO NOT use if you have an allergy to CHG or antibacterial soaps.  If your skin becomes reddened/irritated stop using the CHG and inform your nurse when you arrive at Short Stay. Do not shave (including legs and underarms) for at least 48 hours prior to the first CHG shower.  You may shave your face/neck.  Please follow these instructions carefully:  1.  Shower with CHG Soap the night before surgery and the  morning of surgery.  2.  If you choose to wash your hair, wash your hair first as usual with your normal  shampoo.  3.  After you shampoo, rinse your hair and body thoroughly to remove the shampoo.  4.  Use CHG as you would any other liquid soap.  You can apply chg directly to the skin and wash.  Gently with a scrungie or clean washcloth.  5.  Apply the CHG  Soap to your body ONLY FROM THE NECK DOWN.   Do not use on face/ open                           Wound or open sores. Avoid contact with eyes, ears mouth and genitals (private parts).                       Wash face,  Genitals (private parts) with your normal soap.             6.  Wash thoroughly, paying special attention to the area where your  surgery  will be performed.  7.  Thoroughly rinse your body with warm water from the neck down.  8.  DO NOT shower/wash with your normal soap after using and rinsing off the CHG Soap.            9.  Pat yourself dry with a clean towel.            10.  Wear clean pajamas.            11.  Place clean sheets on your bed the night of your first shower and do not  sleep with pets.  ON THE DAY OF SURGERY : Do not apply any lotions/deodorants the morning of surgery.  Please wear clean clothes to the hospital/surgery center.    FAILURE TO FOLLOW THESE INSTRUCTIONS MAY RESULT IN THE CANCELLATION OF YOUR SURGERY  PATIENT SIGNATURE_________________________________  NURSE SIGNATURE__________________________________  ________________________________________________________________________       [name]    An incentive spirometer is a tool that can help keep your lungs clear and active. This tool measures how well you are filling your lungs with each breath. Taking long deep breaths may help reverse or decrease the chance of developing breathing (pulmonary) problems (especially infection) following: A long period of time when you are unable to move or be active. BEFORE THE PROCEDURE  If the spirometer includes an indicator to show your best effort, your nurse or respiratory therapist will set it to a desired goal. If possible, sit up straight or lean slightly forward. Try not to slouch. Hold the incentive spirometer in an upright position. INSTRUCTIONS FOR USE  Sit on the edge of your bed if possible, or sit up as far as you can in bed or  on a chair. Hold the incentive spirometer in an upright position. Breathe out normally. Place the mouthpiece in your mouth and seal your lips tightly around it. Breathe in slowly and as deeply as possible, raising the piston or the ball toward the top of the column. Hold your breath for 3-5 seconds or for as long as possible. Allow the piston or ball to fall to the bottom of the column. Remove the mouthpiece from your mouth and breathe out normally. Rest for a few seconds and repeat Steps 1 through 7 at least 10 times every 1-2 hours when you are awake. Take your time and take a few normal breaths between deep breaths. The spirometer may include an indicator to show your best effort. Use the indicator as a goal to work toward during each repetition. After each set of 10 deep breaths, practice coughing to be  sure your lungs are clear. If you have an incision (the cut made at the time of surgery), support your incision when coughing by placing a pillow or rolled up towels firmly against it. Once you are able to get out of bed, walk around indoors and cough well. You may stop using the incentive spirometer when instructed by your caregiver.  RISKS AND COMPLICATIONS Take your time so you do not get dizzy or light-headed. If you are in pain, you may need to take or ask for pain medication before doing incentive spirometry. It is harder to take a deep breath if you are having pain. AFTER USE Rest and breathe slowly and easily. It can be helpful to keep track of a log of your progress. Your caregiver can provide you with a simple table to help with this. If you are using the spirometer at home, follow these instructions: [place] IF:  You are having difficultly using the spirometer. You have trouble using the spirometer as often as instructed. Your pain medication is not giving enough relief while using the spirometer. You develop fever of 100.5 F (38.1 C) or higher.                                                                                                     SEEK IMMEDIATE MEDICAL CARE IF:  You cough up bloody sputum that had not been present before. You develop fever of 102 F (38.9 C) or greater. You develop worsening pain at or near the incision site. MAKE SURE YOU:  Understand these instructions. Will watch your condition. Will get help right away if you are not doing well or get worse. Document Released: 09/09/2006 Document Revised: 07/22/2011 Document Reviewed: 11/10/2006 [facility] Patient Information 2014 [place], [place].       WHAT IS A BLOOD TRANSFUSION? Blood Transfusion Information  A transfusion is the replacement of blood or some of its parts. Blood is made up of multiple cells which provide different functions. Red blood cells carry oxygen and are used for blood loss replacement. White blood cells fight against infection. Platelets control bleeding. Plasma helps clot blood. Other blood products are available for specialized needs, such as hemophilia or other clotting disorders. BEFORE THE TRANSFUSION  Who gives blood for transfusions?  Healthy volunteers who are fully evaluated to make sure their blood is safe. This is blood bank blood. Transfusion therapy is the safest it has ever been in the practice of medicine. Before blood is taken from a donor, a complete history is taken to make sure that person has no history of diseases nor engages in risky social behavior (examples are intravenous drug use or sexual activity with multiple partners). The donor's travel history is screened to minimize risk of transmitting infections, such as malaria. The donated blood is tested for signs of infectious diseases, such as HIV and hepatitis. The blood is then tested to be sure it is compatible with you in order to minimize the chance of a transfusion reaction. If you or a relative donates blood, this is often done in anticipation of  surgery and is not appropriate for  emergency situations. It takes many days to process the donated blood. RISKS AND COMPLICATIONS Although transfusion therapy is very safe and saves many lives, the main dangers of transfusion include:  Getting an infectious disease. Developing a transfusion reaction. This is an allergic reaction to something in the blood you were given. Every precaution is taken to prevent this. The decision to have a blood transfusion has been considered carefully by your caregiver before blood is given. Blood is not given unless the benefits outweigh the risks. AFTER THE TRANSFUSION Right after receiving a blood transfusion, you will usually feel much better and more energetic. This is especially true if your red blood cells have gotten low (anemic). The transfusion raises the level of the red blood cells which carry oxygen, and this usually causes an energy increase. The nurse administering the transfusion will monitor you carefully for complications. HOME CARE INSTRUCTIONS  No special instructions are needed after a transfusion. You may find your energy is better. Speak with your caregiver about any limitations on activity for underlying diseases you may have. SEEK MEDICAL CARE IF:  Your condition is not improving after your transfusion. You develop redness or irritation at the intravenous (IV) site. SEEK IMMEDIATE MEDICAL CARE IF:  Any of the following symptoms occur over the next 12 hours: Shaking chills. You have a temperature by mouth above 102 F (38.9 C), not controlled by medicine. Chest, back, or muscle pain. People around you feel you are not acting correctly or are confused. Shortness of breath or difficulty breathing. Dizziness and fainting. You get a rash or develop hives. You have a decrease in urine output. Your urine turns a dark color or changes to pink, red, or brown. Any of the following symptoms occur over the next 10 days: You have a temperature by mouth above 102 F (38.9 C), not  controlled by medicine. Shortness of breath. Weakness after normal activity. The white part of the eye turns yellow (jaundice). You have a decrease in the amount of urine or are urinating less often. Your urine turns a dark color or changes to pink, red, or brown. Document Released: 04/26/2000 Document Revised: 07/22/2011 Document Reviewed: 12/14/2007 [facility] Patient Information 2014 [place], [place].  _______________________________________________________________________

## 2022-07-08 NOTE — Progress Notes (Signed)
COVID Vaccine received:  '[]'$  No '[x]'$  Yes Date of any COVID positive Test in last 84 days:None  PCP - Gaynelle Arabian, MD Cardiologist - None Neurology- Arlice Colt, MD  Chest x-ray -  EKG -  11-15-2020  Epic Stress Test -  ECHO - 11-15-2020  epic Cardiac Cath -   PCR screen: '[x]'$  Ordered & Completed                      '[]'$   No Order but Needs PROFEND                      '[]'$   N/A for this surgery  Surgery Plan:  '[]'$  Ambulatory                            '[x]'$  Outpatient in bed                            '[]'$  Admit  Anesthesia:    '[]'$  General  '[x]'$  Spinal                           '[]'$   Choice '[]'$   MAC  Pacemaker / ICD device '[x]'$  No '[]'$  Yes        Device order form faxed '[x]'$  No    '[]'$   Yes      Faxed to:  Spinal Cord Stimulator:'[x]'$  No '[]'$  Yes      (Remind patient to bring remote DOS) Other Implants:   History of Sleep Apnea? '[x]'$  No '[]'$  Yes   CPAP used?- '[x]'$  No '[]'$  Yes    Does the patient monitor blood sugar? '[]'$  No '[]'$  Yes  '[x]'$  N/A  Blood Thinner / Instructions: Plavix  stop 10 days prior per Costella Hatcher, PA.  Aspirin Instructions: none  ERAS Protocol Ordered: '[]'$  No  '[x]'$  Yes PRE-SURGERY '[x]'$  ENSURE  '[]'$  G2  Patient is to be NPO after: 8:30 am  Activity level: Patient is unable to climb a flight of stairs without difficulty; Patient can perform ADLs without assistance.   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  Patient verbalized understanding and agreement to the Pre-Surgical Instructions that were given to them at this PAT appointment. Patient was also educated of the need to review these PAT instructions again prior to his/her surgery.I reviewed the appropriate phone numbers to call if they have any and questions or concerns.

## 2022-07-11 ENCOUNTER — Encounter (HOSPITAL_COMMUNITY): Payer: Self-pay

## 2022-07-11 ENCOUNTER — Other Ambulatory Visit: Payer: Self-pay

## 2022-07-11 ENCOUNTER — Encounter (HOSPITAL_COMMUNITY)
Admission: RE | Admit: 2022-07-11 | Discharge: 2022-07-11 | Disposition: A | Discharge status: Home / Self Care | Payer: Medicare PPO | Source: Ambulatory Visit | Attending: Orthopedic Surgery | Admitting: Orthopedic Surgery

## 2022-07-11 VITALS — BP 130/82 | HR 54 | Temp 98.5°F | Resp 14 | Ht 61.0 in | Wt 151.0 lb

## 2022-07-11 DIAGNOSIS — I2510 Atherosclerotic heart disease of native coronary artery without angina pectoris: Secondary | ICD-10-CM

## 2022-07-11 DIAGNOSIS — Z01818 Encounter for other preprocedural examination: Secondary | ICD-10-CM | POA: Diagnosis not present

## 2022-07-11 DIAGNOSIS — G988 Other disorders of nervous system: Secondary | ICD-10-CM | POA: Diagnosis not present

## 2022-07-11 DIAGNOSIS — M1612 Unilateral primary osteoarthritis, left hip: Secondary | ICD-10-CM

## 2022-07-11 HISTORY — DX: Anxiety disorder, unspecified: F41.9

## 2022-07-11 LAB — BASIC METABOLIC PANEL
Anion gap: 8 (ref 5–15)
BUN: 15 mg/dL (ref 8–23)
CO2: 27 mmol/L (ref 22–32)
Calcium: 9.8 mg/dL (ref 8.9–10.3)
Chloride: 105 mmol/L (ref 98–111)
Creatinine, Ser: 0.74 mg/dL (ref 0.44–1.00)
GFR, Estimated: >60 mL/min (ref >60)
Glucose, Bld: 120 mg/dL — ABNORMAL HIGH (ref 70–99)
Potassium: 4.3 mmol/L (ref 3.5–5.1)
Sodium: 140 mmol/L (ref 135–145)

## 2022-07-11 LAB — CBC
HCT: 40.5 % (ref 36.0–46.0)
Hemoglobin: 12.9 g/dL (ref 12.0–15.0)
MCH: 30 pg (ref 26.0–34.0)
MCHC: 31.9 g/dL (ref 30.0–36.0)
MCV: 94.2 fL (ref 80.0–100.0)
Platelets: 216 10*3/uL (ref 150–400)
RBC: 4.3 MIL/uL (ref 3.87–5.11)
RDW: 13.8 % (ref 11.5–15.5)
WBC: 5.2 10*3/uL (ref 4.0–10.5)
nRBC: 0 % (ref 0.0–0.2)

## 2022-07-11 LAB — SURGICAL PCR SCREEN
MRSA, PCR: NEGATIVE
Staphylococcus aureus: POSITIVE — AB

## 2022-07-11 LAB — TYPE AND SCREEN
ABO/RH(D): B POS
Antibody Screen: NEGATIVE

## 2022-07-11 NOTE — Progress Notes (Signed)
Patient's PCR screen is positive for STAPH. Appropriate notes have been placed on the patient's chart. This note has been routed to Dr. Alvan Dame and Costella Hatcher, PA   for review. The Patient's surgery is currently scheduled for: 07-23-2022  at [facility].  Leota Jacobsen, BSN, CVRN-BC   Pre-Surgical Testing Nurse [place]  [phone]

## 2022-07-19 NOTE — H&P (Signed)
TOTAL HIP ADMISSION H&P  Patient is admitted for {Left/right:3049041} total hip arthroplasty.  Subjective:  Chief Complaint: {Left/right:3049041} hip pain  HPI: Barbara Singleton, 72 y.o. female, has a history of pain and functional disability in the {Left/right:3049041} hip(s) due to {trauma,arthritis:3049029} and patient has failed non-surgical conservative treatments for greater than 12 weeks to include {nonsurgical conservative treatment (must select two):3049030}.  Onset of symptoms was {abrupt, gradual:20671} starting {1->10 years:3049031} years ago with {stable, gradual worsening, rapidly worsening:3049032} course since that time.The patient noted {no past surgeries, prior procedures:20693} on the {Left/right:3049041} hip(s).  Patient currently rates pain in the {Left/right:3049041} hip at {1-10:3049035} out of 10 with activity. Patient has {night pain, worsening of pain:3049039}. Patient has evidence of {Radiographic or MRI evidence of (must document one of the below):3046104} by imaging studies. This condition presents safety issues increasing the risk of falls. This patient has had {failure of previous osteotomy, proximal femur fracture:3049040}.  There is no current active infection.  Patient Active Problem List   Diagnosis Date Noted  . Stroke (cerebrum) ([place]) 11/14/2020  . Stroke ([place]) 11/14/2020  . Overweight (BMI 25.0-29.9) 07/19/2015  . 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 Anxiety   . Arthritis    Osteoarthritis- knees.  . Depression   . Multiple sclerosis ([place])   . 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 Stroke [facility]) 11/14/2020   Thalmic CVA    Past Surgical History:  Procedure Laterality Date  . BACK SURGERY     fusions x3 lumbar.  Marland Kitchen BREAST EXCISIONAL BIOPSY Left   . CESAREAN SECTION    . DENTAL SURGERY     extractions  . TONSILLECTOMY    . TOTAL KNEE ARTHROPLASTY Right 07/18/2015   Procedure: TOTAL KNEE ARTHROPLASTY;   Surgeon: Paralee Cancel, MD;  Location: [facility] ORS;  Service: Orthopedics;  Laterality: Right;  . TUBAL LIGATION      No current facility-administered medications for this encounter.   Current Outpatient Medications  Medication Sig Dispense Refill Last Dose  . acetaminophen (TYLENOL) 500 MG tablet Take 1,000 mg by mouth every 6 (six) hours as needed for moderate pain.     . Ascorbic Acid (VITAMIN C) 1000 MG tablet Take 1,000 mg by mouth daily.     Marland Kitchen atorvastatin (LIPITOR) 40 MG tablet Take 1 tablet (40 mg total) by mouth daily. 30 tablet 0   . b complex vitamins capsule Take 1 capsule by mouth daily.     . Calcium Carb-Cholecalciferol (CALCIUM 600 + D PO) Take 1-2 tablets by mouth See admin instructions. Take 2 tablets in the morning and 1 tablet at night     . clobetasol (OLUX) 0.05 % topical foam Apply topically 2 (two) times daily. (Patient taking differently: Apply 1 Application topically 2 (two) times daily as needed (psoriasis).) 50 g 6   . clopidogrel (PLAVIX) 75 MG tablet Take 1 tablet (75 mg total) by mouth daily. 30 tablet 0   . fluvoxaMINE (LUVOX) 100 MG tablet Take 100 mg by mouth 3 (three) times daily.      Marland Kitchen lamoTRIgine (LAMICTAL) 100 MG tablet Take 100 mg by mouth 2 (two) times daily.      Marland Kitchen LATUDA 80 MG TABS tablet Take 80 mg by mouth daily.     . methylphenidate (RITALIN) 20 MG tablet Take 1 tablet (20 mg total) by mouth daily. 30 tablet 0   . Multiple Vitamin (MULTIVITAMIN WITH MINERALS) TABS tablet Take 1  tablet by mouth daily.     . traZODone (DESYREL) 100 MG tablet Take 100 mg by mouth at bedtime.     Marland Kitchen oxybutynin (DITROPAN) 5 MG tablet Take 1 tablet (5 mg total) by mouth at bedtime. (Patient not taking: Reported on 07/08/2022) 30 tablet 11 Not Taking   Allergies  Allergen Reactions  . Naproxen Other (See Comments)    HEADACHE   . Other Anxiety    Antihistamines cause anxiety and jitteriness     Social History   Tobacco Use  . Smoking status: Former    Types:  Cigarettes  . Smokeless tobacco: Not on file  . Tobacco comments:    social"-college years"  Substance Use Topics  . Alcohol use: No    Family History  Problem Relation Age of Onset  . CVA Mother   . Brain cancer Father      Review of Systems  Objective:  Physical Exam  Vital signs in last 24 hours:    Labs:   Estimated body mass index is 28.53 kg/m as calculated from the following:   Height as of 07/11/22: '5\' 1"'$  (1.549 m).   Weight as of 07/11/22: 68.5 kg.   Imaging Review Plain radiographs demonstrate {mild/mod/severe:3049053} degenerative joint disease of the {left/right/bi:30031} hip(s). The bone quality appears to be {good/fair/poor/excellent:33178} for age and reported activity level.      Assessment/Plan:  End stage arthritis, {Left/right:3049041} hip(s)  The patient history, physical examination, clinical judgement of the provider and imaging studies are consistent with end stage degenerative joint disease of the {Left/right:3049041} hip(s) and total hip arthroplasty is deemed medically necessary. The treatment options including medical management, injection therapy, arthroscopy and arthroplasty were discussed at length. The risks and benefits of total hip arthroplasty were presented and reviewed. The risks due to aseptic loosening, infection, stiffness, dislocation/subluxation,  thromboembolic complications and other imponderables were discussed.  The patient acknowledged the explanation, agreed to proceed with the plan and consent was signed. Patient is being admitted for inpatient treatment for surgery, pain control, PT, OT, prophylactic antibiotics, VTE prophylaxis, progressive ambulation and ADL's and discharge planning.The patient is planning to be discharged {home with home health services/to inpatient [id number]   {ORTHOADMISSIONSTATUS:21269}

## 2022-07-23 ENCOUNTER — Ambulatory Visit (HOSPITAL_COMMUNITY): Payer: Medicare PPO | Admitting: Physician Assistant

## 2022-07-23 ENCOUNTER — Encounter (HOSPITAL_COMMUNITY): Payer: Self-pay | Admitting: Orthopedic Surgery

## 2022-07-23 ENCOUNTER — Ambulatory Visit (HOSPITAL_COMMUNITY): Payer: Medicare PPO

## 2022-07-23 ENCOUNTER — Other Ambulatory Visit: Payer: Self-pay

## 2022-07-23 ENCOUNTER — Observation Stay (HOSPITAL_COMMUNITY)
Admission: RE | Admit: 2022-07-23 | Discharge: 2022-07-24 | Disposition: A | Discharge status: Home / Self Care | Payer: Medicare PPO | Attending: Orthopedic Surgery | Admitting: Orthopedic Surgery

## 2022-07-23 ENCOUNTER — Observation Stay (HOSPITAL_COMMUNITY): Payer: Medicare PPO

## 2022-07-23 ENCOUNTER — Ambulatory Visit (HOSPITAL_BASED_OUTPATIENT_CLINIC_OR_DEPARTMENT_OTHER): Payer: Medicare PPO | Admitting: Certified Registered"

## 2022-07-23 ENCOUNTER — Encounter (HOSPITAL_COMMUNITY)
Admission: RE | Disposition: A | Discharge status: Home / Self Care | Payer: Self-pay | Source: Home / Self Care | Attending: Orthopedic Surgery

## 2022-07-23 DIAGNOSIS — F418 Other specified anxiety disorders: Secondary | ICD-10-CM

## 2022-07-23 DIAGNOSIS — Z96642 Presence of left artificial hip joint: Secondary | ICD-10-CM | POA: Diagnosis not present

## 2022-07-23 DIAGNOSIS — Z7902 Long term (current) use of antithrombotics/antiplatelets: Secondary | ICD-10-CM | POA: Diagnosis not present

## 2022-07-23 DIAGNOSIS — Z8673 Personal history of transient ischemic attack (TIA), and cerebral infarction without residual deficits: Secondary | ICD-10-CM | POA: Insufficient documentation

## 2022-07-23 DIAGNOSIS — Z79899 Other long term (current) drug therapy: Secondary | ICD-10-CM | POA: Diagnosis not present

## 2022-07-23 DIAGNOSIS — Z96651 Presence of right artificial knee joint: Secondary | ICD-10-CM | POA: Diagnosis not present

## 2022-07-23 DIAGNOSIS — Z87891 Personal history of nicotine dependence: Secondary | ICD-10-CM

## 2022-07-23 DIAGNOSIS — M25752 Osteophyte, left hip: Secondary | ICD-10-CM | POA: Diagnosis not present

## 2022-07-23 DIAGNOSIS — M1612 Unilateral primary osteoarthritis, left hip: Principal | ICD-10-CM | POA: Insufficient documentation

## 2022-07-23 DIAGNOSIS — Z471 Aftercare following joint replacement surgery: Secondary | ICD-10-CM | POA: Diagnosis not present

## 2022-07-23 DIAGNOSIS — M1990 Unspecified osteoarthritis, unspecified site: Secondary | ICD-10-CM | POA: Diagnosis not present

## 2022-07-23 DIAGNOSIS — G35 Multiple sclerosis: Secondary | ICD-10-CM

## 2022-07-23 HISTORY — PX: TOTAL HIP ARTHROPLASTY: SHX124

## 2022-07-23 SURGERY — ARTHROPLASTY, HIP, TOTAL, ANTERIOR APPROACH
Anesthesia: General | Site: Hip | Laterality: Left

## 2022-07-23 MED ORDER — FENTANYL CITRATE PF 50 MCG/ML IJ SOSY
PREFILLED_SYRINGE | INTRAMUSCULAR | Status: AC
  Filled 2022-07-23: qty 1

## 2022-07-23 MED ORDER — ONDANSETRON HCL 4 MG/2ML IJ SOLN: 4.0000 mg | Freq: Four times a day (QID) | INTRAMUSCULAR | Status: DC | PRN

## 2022-07-23 MED ORDER — CEFAZOLIN SODIUM-DEXTROSE 2-4 GM/100ML-% IV SOLN
2.0000 g | INTRAVENOUS | Status: AC
  Administered 2022-07-23: 2 g via INTRAVENOUS
  Filled 2022-07-23: qty 100

## 2022-07-23 MED ORDER — TRANEXAMIC ACID-NACL 1000-0.7 MG/100ML-% IV SOLN
1000.0000 mg | Freq: Once | INTRAVENOUS | Status: AC
  Administered 2022-07-23: 1000 mg via INTRAVENOUS
  Filled 2022-07-23: qty 100

## 2022-07-23 MED ORDER — DEXAMETHASONE SODIUM PHOSPHATE 10 MG/ML IJ SOLN
10.0000 mg | Freq: Once | INTRAMUSCULAR | Status: AC
  Administered 2022-07-24: 10 mg via INTRAVENOUS
  Filled 2022-07-23: qty 1

## 2022-07-23 MED ORDER — HYDROMORPHONE HCL 1 MG/ML IJ SOLN
INTRAMUSCULAR | Status: AC
  Administered 2022-07-23: 0.5 mg
  Filled 2022-07-23: qty 1

## 2022-07-23 MED ORDER — KETAMINE HCL 10 MG/ML IJ SOLN
INTRAMUSCULAR | Status: DC | PRN
  Administered 2022-07-23: 20 mg via INTRAVENOUS

## 2022-07-23 MED ORDER — ACETAMINOPHEN 325 MG PO TABS: 325.0000 mg | ORAL_TABLET | Freq: Four times a day (QID) | ORAL | Status: DC | PRN

## 2022-07-23 MED ORDER — FENTANYL CITRATE PF 50 MCG/ML IJ SOSY
25.0000 ug | PREFILLED_SYRINGE | INTRAMUSCULAR | Status: DC | PRN
  Administered 2022-07-23 (×4): 25 ug via INTRAVENOUS
  Administered 2022-07-23: 50 ug via INTRAVENOUS

## 2022-07-23 MED ORDER — DEXAMETHASONE SODIUM PHOSPHATE 10 MG/ML IJ SOLN
INTRAMUSCULAR | Status: AC
  Filled 2022-07-23: qty 1

## 2022-07-23 MED ORDER — FENTANYL CITRATE (PF) 100 MCG/2ML IJ SOLN
INTRAMUSCULAR | Status: DC | PRN
  Administered 2022-07-23: 75 ug via INTRAVENOUS
  Administered 2022-07-23: 25 ug via INTRAVENOUS

## 2022-07-23 MED ORDER — ACETAMINOPHEN 500 MG PO TABS
1000.0000 mg | ORAL_TABLET | Freq: Once | ORAL | Status: AC
  Administered 2022-07-23: 1000 mg via ORAL
  Filled 2022-07-23: qty 2

## 2022-07-23 MED ORDER — MENTHOL 3 MG MT LOZG: 1.0000 | LOZENGE | OROMUCOSAL | Status: DC | PRN

## 2022-07-23 MED ORDER — ALBUMIN HUMAN 5 % IV SOLN
INTRAVENOUS | Status: AC
  Filled 2022-07-23: qty 250

## 2022-07-23 MED ORDER — ATORVASTATIN CALCIUM 40 MG PO TABS
40.0000 mg | ORAL_TABLET | Freq: Every day | ORAL | Status: DC
  Administered 2022-07-24: 40 mg via ORAL
  Filled 2022-07-23: qty 1

## 2022-07-23 MED ORDER — LIDOCAINE HCL (CARDIAC) PF 100 MG/5ML IV SOSY
PREFILLED_SYRINGE | INTRAVENOUS | Status: DC | PRN
  Administered 2022-07-23: 60 mg via INTRATRACHEAL

## 2022-07-23 MED ORDER — SODIUM CHLORIDE 0.9 % IV SOLN: INTRAVENOUS | Status: DC

## 2022-07-23 MED ORDER — FENTANYL CITRATE (PF) 100 MCG/2ML IJ SOLN
INTRAMUSCULAR | Status: AC
  Filled 2022-07-23: qty 2

## 2022-07-23 MED ORDER — CEFAZOLIN SODIUM-DEXTROSE 2-4 GM/100ML-% IV SOLN
2.0000 g | Freq: Four times a day (QID) | INTRAVENOUS | Status: AC
  Administered 2022-07-23 – 2022-07-24 (×2): 2 g via INTRAVENOUS
  Filled 2022-07-23 (×2): qty 100

## 2022-07-23 MED ORDER — KETOROLAC TROMETHAMINE 30 MG/ML IJ SOLN
INTRAMUSCULAR | Status: AC
  Filled 2022-07-23: qty 1

## 2022-07-23 MED ORDER — PROPOFOL 10 MG/ML IV BOLUS
INTRAVENOUS | Status: AC
  Filled 2022-07-23: qty 20

## 2022-07-23 MED ORDER — MIDAZOLAM HCL 5 MG/5ML IJ SOLN
INTRAMUSCULAR | Status: DC | PRN
  Administered 2022-07-23: 1 mg via INTRAVENOUS

## 2022-07-23 MED ORDER — METHYLPHENIDATE HCL 5 MG PO TABS: 20.0000 mg | ORAL_TABLET | Freq: Every day | ORAL | Status: DC

## 2022-07-23 MED ORDER — DOCUSATE SODIUM 100 MG PO CAPS
100.0000 mg | ORAL_CAPSULE | Freq: Two times a day (BID) | ORAL | Status: DC
  Administered 2022-07-23 – 2022-07-24 (×2): 100 mg via ORAL
  Filled 2022-07-23 (×2): qty 1

## 2022-07-23 MED ORDER — PHENYLEPHRINE HCL-NACL 20-0.9 MG/250ML-% IV SOLN
INTRAVENOUS | Status: DC | PRN
  Administered 2022-07-23: 30 ug/min via INTRAVENOUS

## 2022-07-23 MED ORDER — METHOCARBAMOL 500 MG PO TABS
500.0000 mg | ORAL_TABLET | Freq: Four times a day (QID) | ORAL | Status: DC | PRN
  Administered 2022-07-24: 500 mg via ORAL
  Filled 2022-07-23: qty 1

## 2022-07-23 MED ORDER — AMISULPRIDE (ANTIEMETIC) 5 MG/2ML IV SOLN: 10.0000 mg | Freq: Once | INTRAVENOUS | Status: DC | PRN

## 2022-07-23 MED ORDER — LACTATED RINGERS IV SOLN: INTRAVENOUS | Status: DC | PRN

## 2022-07-23 MED ORDER — SODIUM CHLORIDE (PF) 0.9 % IJ SOLN
INTRAMUSCULAR | Status: AC
  Filled 2022-07-23: qty 50

## 2022-07-23 MED ORDER — MIDAZOLAM HCL 2 MG/2ML IJ SOLN
INTRAMUSCULAR | Status: AC
  Filled 2022-07-23: qty 2

## 2022-07-23 MED ORDER — SODIUM CHLORIDE (PF) 0.9 % IJ SOLN
INTRAMUSCULAR | Status: DC | PRN
  Administered 2022-07-23: 30 mL

## 2022-07-23 MED ORDER — ROCURONIUM BROMIDE 100 MG/10ML IV SOLN
INTRAVENOUS | Status: DC | PRN
  Administered 2022-07-23: 40 mg via INTRAVENOUS

## 2022-07-23 MED ORDER — METOCLOPRAMIDE HCL 5 MG PO TABS: 5.0000 mg | ORAL_TABLET | Freq: Three times a day (TID) | ORAL | Status: DC | PRN

## 2022-07-23 MED ORDER — ONDANSETRON HCL 4 MG/2ML IJ SOLN
INTRAMUSCULAR | Status: DC | PRN
  Administered 2022-07-23: 4 mg via INTRAVENOUS

## 2022-07-23 MED ORDER — ORAL CARE MOUTH RINSE: 15.0000 mL | Freq: Once | OROMUCOSAL | Status: AC

## 2022-07-23 MED ORDER — HYDROMORPHONE HCL 1 MG/ML IJ SOLN
1.0000 mg | INTRAMUSCULAR | Status: AC | PRN
  Administered 2022-07-23: 0.5 mg via INTRAVENOUS

## 2022-07-23 MED ORDER — FLUVOXAMINE MALEATE 50 MG PO TABS
100.0000 mg | ORAL_TABLET | Freq: Three times a day (TID) | ORAL | Status: DC
  Administered 2022-07-23 – 2022-07-24 (×3): 100 mg via ORAL
  Filled 2022-07-23 (×4): qty 2

## 2022-07-23 MED ORDER — KETAMINE HCL 50 MG/5ML IJ SOSY
PREFILLED_SYRINGE | INTRAMUSCULAR | Status: AC
  Filled 2022-07-23: qty 5

## 2022-07-23 MED ORDER — OXYCODONE HCL 5 MG PO TABS
5.0000 mg | ORAL_TABLET | ORAL | Status: DC | PRN
  Administered 2022-07-23 – 2022-07-24 (×5): 5 mg via ORAL
  Filled 2022-07-23 (×4): qty 1

## 2022-07-23 MED ORDER — ONDANSETRON HCL 4 MG/2ML IJ SOLN
INTRAMUSCULAR | Status: AC
  Filled 2022-07-23: qty 2

## 2022-07-23 MED ORDER — LACTATED RINGERS IV SOLN: INTRAVENOUS | Status: DC

## 2022-07-23 MED ORDER — FENTANYL CITRATE PF 50 MCG/ML IJ SOSY
PREFILLED_SYRINGE | INTRAMUSCULAR | Status: AC
  Filled 2022-07-23: qty 2

## 2022-07-23 MED ORDER — 0.9 % SODIUM CHLORIDE (POUR BTL) OPTIME
TOPICAL | Status: DC | PRN
  Administered 2022-07-23: 1000 mL

## 2022-07-23 MED ORDER — POVIDONE-IODINE 10 % EX SWAB
2.0000 | Freq: Once | CUTANEOUS | Status: AC
  Administered 2022-07-23: 2 via TOPICAL

## 2022-07-23 MED ORDER — EPHEDRINE SULFATE (PRESSORS) 50 MG/ML IJ SOLN
INTRAMUSCULAR | Status: DC | PRN
  Administered 2022-07-23: 10 mg via INTRAVENOUS
  Administered 2022-07-23: 15 mg via INTRAVENOUS

## 2022-07-23 MED ORDER — OXYCODONE HCL 5 MG PO TABS
ORAL_TABLET | ORAL | Status: AC
  Filled 2022-07-23: qty 1

## 2022-07-23 MED ORDER — DIPHENHYDRAMINE HCL 12.5 MG/5ML PO ELIX: 12.5000 mg | ORAL_SOLUTION | ORAL | Status: DC | PRN

## 2022-07-23 MED ORDER — METHOCARBAMOL 500 MG IVPB - SIMPLE MED: 500.0000 mg | Freq: Four times a day (QID) | INTRAVENOUS | Status: DC | PRN

## 2022-07-23 MED ORDER — CHLORHEXIDINE GLUCONATE 0.12 % MT SOLN
15.0000 mL | Freq: Once | OROMUCOSAL | Status: AC
  Administered 2022-07-23: 15 mL via OROMUCOSAL

## 2022-07-23 MED ORDER — BISACODYL 10 MG RE SUPP: 10.0000 mg | Freq: Every day | RECTAL | Status: DC | PRN

## 2022-07-23 MED ORDER — ONDANSETRON HCL 4 MG PO TABS: 4.0000 mg | ORAL_TABLET | Freq: Four times a day (QID) | ORAL | Status: DC | PRN

## 2022-07-23 MED ORDER — HYDROMORPHONE HCL 1 MG/ML IJ SOLN
0.5000 mg | INTRAMUSCULAR | Status: DC | PRN
  Administered 2022-07-23: 1 mg via INTRAVENOUS
  Filled 2022-07-23: qty 1

## 2022-07-23 MED ORDER — EPHEDRINE 5 MG/ML INJ
INTRAVENOUS | Status: AC
  Filled 2022-07-23: qty 5

## 2022-07-23 MED ORDER — ASPIRIN 81 MG PO CHEW
81.0000 mg | CHEWABLE_TABLET | Freq: Two times a day (BID) | ORAL | Status: DC
  Administered 2022-07-23 – 2022-07-24 (×2): 81 mg via ORAL
  Filled 2022-07-23 (×2): qty 1

## 2022-07-23 MED ORDER — POLYETHYLENE GLYCOL 3350 17 G PO PACK
17.0000 g | PACK | Freq: Two times a day (BID) | ORAL | Status: DC
  Administered 2022-07-23 – 2022-07-24 (×2): 17 g via ORAL
  Filled 2022-07-23 (×2): qty 1

## 2022-07-23 MED ORDER — ONDANSETRON HCL 4 MG/2ML IJ SOLN: 4.0000 mg | Freq: Once | INTRAMUSCULAR | Status: DC | PRN

## 2022-07-23 MED ORDER — BUPIVACAINE-EPINEPHRINE (PF) 0.25% -1:200000 IJ SOLN
INTRAMUSCULAR | Status: AC
  Filled 2022-07-23: qty 30

## 2022-07-23 MED ORDER — CLOPIDOGREL BISULFATE 75 MG PO TABS
75.0000 mg | ORAL_TABLET | Freq: Every day | ORAL | Status: DC
  Administered 2022-07-24: 75 mg via ORAL
  Filled 2022-07-23: qty 1

## 2022-07-23 MED ORDER — ALBUMIN HUMAN 5 % IV SOLN: INTRAVENOUS | Status: DC | PRN

## 2022-07-23 MED ORDER — SUGAMMADEX SODIUM 200 MG/2ML IV SOLN
INTRAVENOUS | Status: DC | PRN
  Administered 2022-07-23: 150 mg via INTRAVENOUS

## 2022-07-23 MED ORDER — PHENOL 1.4 % MT LIQD: 1.0000 | OROMUCOSAL | Status: DC | PRN

## 2022-07-23 MED ORDER — CLOBETASOL PROPIONATE 0.05 % EX FOAM: 1.0000 | Freq: Two times a day (BID) | CUTANEOUS | Status: DC | PRN

## 2022-07-23 MED ORDER — LURASIDONE HCL 40 MG PO TABS
80.0000 mg | ORAL_TABLET | Freq: Every day | ORAL | Status: DC
  Administered 2022-07-24: 80 mg via ORAL
  Filled 2022-07-23: qty 2

## 2022-07-23 MED ORDER — KETOROLAC TROMETHAMINE 30 MG/ML IJ SOLN
INTRAMUSCULAR | Status: DC | PRN
  Administered 2022-07-23: 30 mg

## 2022-07-23 MED ORDER — METOCLOPRAMIDE HCL 5 MG/ML IJ SOLN: 5.0000 mg | Freq: Three times a day (TID) | INTRAMUSCULAR | Status: DC | PRN

## 2022-07-23 MED ORDER — LAMOTRIGINE 100 MG PO TABS
100.0000 mg | ORAL_TABLET | Freq: Two times a day (BID) | ORAL | Status: DC
  Administered 2022-07-23 – 2022-07-24 (×2): 100 mg via ORAL
  Filled 2022-07-23 (×2): qty 1

## 2022-07-23 MED ORDER — ACETAMINOPHEN 500 MG PO TABS
1000.0000 mg | ORAL_TABLET | Freq: Four times a day (QID) | ORAL | Status: DC
  Administered 2022-07-23 – 2022-07-24 (×3): 1000 mg via ORAL
  Filled 2022-07-23 (×3): qty 2

## 2022-07-23 MED ORDER — BUPIVACAINE-EPINEPHRINE (PF) 0.25% -1:200000 IJ SOLN
INTRAMUSCULAR | Status: DC | PRN
  Administered 2022-07-23: 30 mL

## 2022-07-23 MED ORDER — PROPOFOL 10 MG/ML IV BOLUS
INTRAVENOUS | Status: DC | PRN
  Administered 2022-07-23: 100 mg via INTRAVENOUS

## 2022-07-23 MED ORDER — OXYCODONE HCL 5 MG PO TABS: 10.0000 mg | ORAL_TABLET | ORAL | Status: DC | PRN

## 2022-07-23 MED ORDER — TRAZODONE HCL 100 MG PO TABS
100.0000 mg | ORAL_TABLET | Freq: Every day | ORAL | Status: DC
  Administered 2022-07-23: 100 mg via ORAL
  Filled 2022-07-23: qty 1

## 2022-07-23 MED ORDER — METHOCARBAMOL 500 MG IVPB - SIMPLE MED
INTRAVENOUS | Status: AC
  Administered 2022-07-23: 500 mg
  Filled 2022-07-23: qty 55

## 2022-07-23 MED ORDER — DEXAMETHASONE SODIUM PHOSPHATE 10 MG/ML IJ SOLN
8.0000 mg | Freq: Once | INTRAMUSCULAR | Status: AC
  Administered 2022-07-23: 10 mg via INTRAVENOUS

## 2022-07-23 MED ORDER — TRANEXAMIC ACID-NACL 1000-0.7 MG/100ML-% IV SOLN
1000.0000 mg | INTRAVENOUS | Status: DC
  Filled 2022-07-23: qty 100

## 2022-07-23 MED ORDER — STERILE WATER FOR IRRIGATION IR SOLN
Status: DC | PRN
  Administered 2022-07-23: 2000 mL

## 2022-07-23 SURGICAL SUPPLY — 45 items
ADH SKN CLS APL DERMABOND .7 (GAUZE/BANDAGES/DRESSINGS) ×1
BAG COUNTER SPONGE SURGICOUNT (BAG) IMPLANT
BAG DECANTER FOR FLEXI CONT (MISCELLANEOUS) IMPLANT
BAG SPEC THK2 15X12 ZIP CLS (MISCELLANEOUS)
BAG SPNG CNTER NS LX DISP (BAG) ×1
BAG ZIPLOCK 12X15 (MISCELLANEOUS) IMPLANT
BALL HIP CERAMIC (Hips) IMPLANT
BLADE SAG 18X100X1.27 (BLADE) ×1 IMPLANT
COVER PERINEAL POST (MISCELLANEOUS) ×1 IMPLANT
COVER SURGICAL LIGHT HANDLE (MISCELLANEOUS) ×1 IMPLANT
CUP ACET PINNACLE SECTR 50MM (Hips) IMPLANT
DERMABOND ADVANCED .7 DNX12 (GAUZE/BANDAGES/DRESSINGS) ×1 IMPLANT
DRAPE FOOT SWITCH (DRAPES) ×1 IMPLANT
DRAPE STERI IOBAN 125X83 (DRAPES) ×1 IMPLANT
DRAPE U-SHAPE 47X51 STRL (DRAPES) ×2 IMPLANT
DRESSING AQUACEL AG SP 3.5X10 (GAUZE/BANDAGES/DRESSINGS) ×1 IMPLANT
DRSG AQUACEL AG ADV 3.5X10 (GAUZE/BANDAGES/DRESSINGS) IMPLANT
DRSG AQUACEL AG SP 3.5X10 (GAUZE/BANDAGES/DRESSINGS) ×1
DURAPREP 26ML APPLICATOR (WOUND CARE) ×1 IMPLANT
ELECT REM PT RETURN 15FT ADLT (MISCELLANEOUS) ×1 IMPLANT
FEM STEM 12/14 TAPER SZ 4 HIP (Orthopedic Implant) ×1 IMPLANT
FEMORAL STEM 12/14 TPR SZ4 HIP (Orthopedic Implant) IMPLANT
GLOVE BIO SURGEON STRL SZ 6 (GLOVE) ×1 IMPLANT
GLOVE BIOGEL PI IND STRL 6.5 (GLOVE) ×1 IMPLANT
GLOVE BIOGEL PI IND STRL 7.5 (GLOVE) ×1 IMPLANT
GLOVE ORTHO TXT STRL SZ7.5 (GLOVE) ×2 IMPLANT
GOWN STRL REUS W/ TWL LRG LVL3 (GOWN DISPOSABLE) ×2 IMPLANT
GOWN STRL REUS W/TWL LRG LVL3 (GOWN DISPOSABLE) ×2
HIP BALL CERAMIC (Hips) ×1 IMPLANT
HOLDER FOLEY CATH W/STRAP (MISCELLANEOUS) ×1 IMPLANT
KIT TURNOVER KIT A (KITS) IMPLANT
LINER ACET PNNCL PLUS4 NEUTRAL (Hips) IMPLANT
PACK ANTERIOR HIP CUSTOM (KITS) ×1 IMPLANT
PINNACLE PLUS 4 NEUTRAL (Hips) ×1 IMPLANT
PINNACLE SECTOR CUP 50MM (Hips) ×1 IMPLANT
SCREW PINN CAN BONE 6.5MMX15MM (Screw) IMPLANT
SUT MNCRL AB 4-0 PS2 18 (SUTURE) ×1 IMPLANT
SUT STRATAFIX 0 PDS 27 VIOLET (SUTURE) ×1
SUT VIC AB 1 CT1 36 (SUTURE) ×3 IMPLANT
SUT VIC AB 2-0 CT1 27 (SUTURE) ×2
SUT VIC AB 2-0 CT1 TAPERPNT 27 (SUTURE) ×2 IMPLANT
SUTURE STRATFX 0 PDS 27 VIOLET (SUTURE) ×1 IMPLANT
TRAY FOLEY MTR SLVR 16FR STAT (SET/KITS/TRAYS/PACK) IMPLANT
TUBE SUCTION HIGH CAP CLEAR NV (SUCTIONS) ×1 IMPLANT
WATER STERILE IRR 1000ML POUR (IV SOLUTION) ×1 IMPLANT

## 2022-07-23 NOTE — Anesthesia Preprocedure Evaluation (Addendum)
Anesthesia Evaluation  Patient identified by MRN, date of birth, ID band Patient awake    Reviewed: Allergy & Precautions, NPO status , Patient's Chart, lab work & pertinent test results  Airway Mallampati: II  TM Distance: >3 FB Neck ROM: Full    Dental no notable dental hx.    Pulmonary former smoker   Pulmonary exam normal        Cardiovascular negative cardio ROS Normal cardiovascular exam     Neuro/Psych  PSYCHIATRIC DISORDERS Anxiety Depression    Multiple sclerosis  Neuromuscular disease CVA    GI/Hepatic negative GI ROS, Neg liver ROS,,,  Endo/Other  negative endocrine ROS    Renal/GU negative Renal ROS     Musculoskeletal  (+) Arthritis ,    Abdominal   Peds  Hematology  (+) Blood dyscrasia (Plavix)   Anesthesia Other Findings Left hip osteoarthritis  Reproductive/Obstetrics                             Anesthesia Physical Anesthesia Plan  ASA: 3  Anesthesia Plan: General   Post-op Pain Management:    Induction: Intravenous  PONV Risk Score and Plan: 3 and Ondansetron, Dexamethasone and Treatment may vary due to age or medical condition  Airway Management Planned: Oral ETT  Additional Equipment:   Intra-op Plan:   Post-operative Plan: Extubation in OR  Informed Consent: I have reviewed the patients History and Physical, chart, labs and discussed the procedure including the risks, benefits and alternatives for the proposed anesthesia with the patient or authorized representative who has indicated his/her understanding and acceptance.     Dental advisory given  Plan Discussed with: CRNA  Anesthesia Plan Comments:         Anesthesia Quick Evaluation

## 2022-07-23 NOTE — Care Plan (Signed)
Ortho Bundle Case Management Note  Patient Details  Name: SKYY SCHRAEDER MRN: [id number] Date of Birth: 06-25-1950                  L THA on 07-23-22  DCP: Home with husband  DME: RW ordered through [facility]  PT: HEP   DME Arranged:  Walker rolling DME Agency:  [employer]    Additional Comments: Please contact me with any questions of if this plan should need to change.  Marianne Sofia, RN,CCM [facility]  [phone] 07/23/2022, 1:09 PM

## 2022-07-23 NOTE — Op Note (Addendum)
NAME:  Barbara Singleton                ACCOUNT NO.: [id number]      MEDICAL RECORD NO.: [id number]      FACILITY:  [facility]      PHYSICIAN:  Mauri Pole  DATE OF BIRTH:  [birth year]     DATE OF PROCEDURE:  07/23/2022                                 OPERATIVE REPORT         PREOPERATIVE DIAGNOSIS: Left  hip osteoarthritis.      POSTOPERATIVE DIAGNOSIS:  Left hip osteoarthritis.      PROCEDURE:  Left total hip replacement through an anterior approach   utilizing DePuy THR system, component size 50 mm pinnacle cup, a size 32+4 neutral   Altrex liner, a size 4 Hi Actis stem with a 32+5 delta ceramic   ball.      SURGEON:  Pietro Cassis. Alvan Dame, M.D.      ASSISTANT:  Costella Hatcher, PA-C     ANESTHESIA:  General due to history of significant lumbar spine instrumented fusion.      SPECIMENS:  None.      COMPLICATIONS:  None.      BLOOD LOSS:  450 cc     DRAINS:  None.      INDICATION OF THE PROCEDURE:  Barbara Singleton is a 72 y.o. female who had   presented to office for evaluation of left hip pain.  Radiographs revealed   progressive degenerative changes with bone-on-bone   articulation of the  hip joint, including subchondral cystic changes and osteophytes.  The patient had painful limited range of   motion significantly affecting their overall quality of life and function.  The patient was failing to    respond to conservative measures including medications and/or injections and activity modification and at this point was ready   to proceed with more definitive measures.  Consent was obtained for   benefit of pain relief.  Specific risks of infection, DVT, component   failure, dislocation, neurovascular injury, and need for revision surgery were reviewed in the office.     PROCEDURE IN DETAIL:  The patient was brought to operative theater.   Once adequate anesthesia, preoperative antibiotics, 2 gm of Ancef, 1 gm of Tranexamic Acid, and 10 mg of Decadron  were administered, the patient was positioned supine on the [employer] table.  Once the patient was safely positioned with adequate padding of boney prominences we predraped out the hip, and used fluoroscopy to confirm orientation of the pelvis.      The left hip was then prepped and draped from proximal iliac crest to   mid thigh with a shower curtain technique.      Time-out was performed identifying the patient, planned procedure, and the appropriate extremity.     An incision was then made 2 cm lateral to the   anterior superior iliac spine extending over the orientation of the   tensor fascia lata muscle and sharp dissection was carried down to the   fascia of the muscle.      The fascia was then incised.  The muscle belly was identified and swept   laterally and retractor placed along the superior neck.  Following   cauterization of the circumflex vessels and removing some pericapsular   fat,  a second cobra retractor was placed on the inferior neck.  A T-capsulotomy was made along the line of the   superior neck to the trochanteric fossa, then extended proximally and   distally.  Tag sutures were placed and the retractors were then placed   intracapsular.  We then identified the trochanteric fossa and   orientation of my neck cut and then made a neck osteotomy with the femur on traction.  The femoral   head was removed without difficulty or complication.  Traction was let   off and retractors were placed posterior and anterior around the   acetabulum.      The labrum and foveal tissue were debrided.  I began reaming with a 45 mm   reamer and reamed up to 49 mm reamer with good bony bed preparation and a 50 mm  cup was chosen.  The final 50 mm Pinnacle cup was then impacted under fluoroscopy to confirm the depth of penetration and orientation with respect to   Abduction and forward flexion.  A screw was placed into the ilium followed by the hole eliminator.  The final   32+4 neutral  Altrex liner was impacted with good visualized rim fit.  The cup was positioned anatomically within the acetabular portion of the pelvis.      At this point, the femur was rolled to 100 degrees.  Further capsule was   released off the inferior aspect of the femoral neck.  I then   released the superior capsule proximally.  With the leg in a neutral position the hook was placed laterally   along the femur under the vastus lateralis origin and elevated manually and then held in position using the hook attachment on the bed.  The leg was then extended and adducted with the leg rolled to 100   degrees of external rotation.  Retractors were placed along the medial calcar and posteriorly over the greater trochanter.  Once the proximal femur was fully   exposed, I used a box osteotome to set orientation.  I then began   broaching with the starting chili pepper broach and passed this by hand and then broached up to 4.  With the 4 broach in place I chose a high offset neck and did several trial reductions.  The offset was appropriate, leg lengths   appeared to be equal best matched with the +5 head ball trial confirmed radiographically.   Given these findings, I went ahead and dislocated the hip, repositioned all   retractors and positioned the right hip in the extended and abducted position.  The final 4 Hi Actis stem was   chosen and it was impacted down to the level of neck cut.  Based on this   and the trial reductions, a final 32+5 delta ceramic ball was chosen and   impacted onto a clean and dry trunnion, and the hip was reduced.  The   hip had been irrigated throughout the case again at this point.  I did   reapproximate the superior capsular leaflet to the anterior leaflet   using #1 Vicryl.  The fascia of the   tensor fascia lata muscle was then reapproximated using #1 Vicryl and #0 Stratafix sutures.  The   remaining wound was closed with 2-0 Vicryl and running 4-0 Monocryl.   The hip was  cleaned, dried, and dressed sterilely using Dermabond and   Aquacel dressing.  The patient was then brought   to recovery room in stable  condition tolerating the procedure well.    Costella Hatcher, PA-C was present for the entirety of the case involved from   preoperative positioning, perioperative retractor management, general   facilitation of the case, as well as primary wound closure as assistant.            Pietro Cassis Alvan Dame, M.D.        07/23/2022 10:10 AM

## 2022-07-23 NOTE — Discharge Instructions (Signed)

## 2022-07-23 NOTE — Evaluation (Signed)
Physical Therapy Evaluation Patient Details Name: Barbara Singleton MRN: [id number] DOB: 12-05-1950 Today's Date: 07/23/2022  History of Present Illness  72 yo female presents to therapy s/p L THA, anterior approach on 07/23/2022 due to failure of conservative measures. Pt PMH includes but is not limited to: CVA, R TKA (2017), MS, and lumbar fusion.  Clinical Impression  Barbara Singleton is a 72 y.o. female POD 0 s/p L THA, anterior approach. Patient reports mod I with mobility at baseline. Patient is now limited by functional impairments (see PT problem list below) and requires min guard for bed mobility and min guard for transfers. Patient was able to ambulate 12 and 8 feet with RW and min guard level of assist. Pt limited due to felling shaky, desaturation on RA and fatigue. No change in pain t/o eval 5/10 with pt provided with pain medication 1.5 hrs prior to intervention.  Patient instructed in exercise to facilitate ROM and circulation to manage edema. Pt left in recliner, all needs met and nurse donning [place] for supplemental O2.  Patient will benefit from continued skilled PT interventions to address impairments and progress towards PLOF. Acute PT will follow to progress mobility and stair training in preparation for safe discharge home with family support and HEP.  -Semi reclined at rest in bed Bp 106/64 and O2 saturation 88% on RA with pt able to recover to 90% with cues        for pursed lip breathing -seated EOB BP 117/74 O2 decreased to 86% -standing 124/74 and O2 91% on RA s/p gait tasks pt O2 decreased to 87% on RA and 23s to recover once seated to 90% on RA and cues for pursed lip breathing     Recommendations for follow up therapy are one component of a multi-disciplinary discharge planning process, led by the attending physician.  Recommendations may be updated based on patient status, additional functional criteria and insurance authorization.  Follow Up Recommendations Follow  physician's recommendations for discharge plan and follow up therapies      Assistance Recommended at Discharge Frequent or constant Supervision/Assistance  Patient can return home with the following  A little help with walking and/or transfers;A little help with bathing/dressing/bathroom;Assistance with cooking/housework;Assist for transportation;Help with stairs or ramp for entrance    Equipment Recommendations Rolling walker (2 wheels)  Recommendations for Other Services       Functional Status Assessment Patient has had a recent decline in their functional status and demonstrates the ability to make significant improvements in function in a reasonable and predictable amount of time.     Precautions / Restrictions Precautions Precautions: Fall Precaution Comments: monitor O2 Restrictions Weight Bearing Restrictions: No      Mobility  Bed Mobility Overal bed mobility: Needs Assistance Bed Mobility: Supine to Sit     Supine to sit: Min guard     General bed mobility comments: increased time HOB elevated and use of bed rail    Transfers Overall transfer level: Needs assistance Equipment used: Rolling walker (2 wheels) Transfers: Sit to/from Stand Sit to Stand: Min guard           General transfer comment: cues for proper UE placement (bed, recliner and commode transfers completed)    Ambulation/Gait Ambulation/Gait assistance: Min guard Gait Distance (Feet): 12 Feet Assistive device: Rolling walker (2 wheels) Gait Pattern/deviations: Step-to pattern, Antalgic Gait velocity: decreased     General Gait Details: limited due to fatigue and SOB  Stairs  Wheelchair Mobility    Modified Rankin (Stroke Patients Only)       Balance Overall balance assessment: Needs assistance Sitting-balance support: Feet supported Sitting balance-Leahy Scale: Fair     Standing balance support: Bilateral upper extremity supported, During functional  activity Standing balance-Leahy Scale: Poor                               Pertinent Vitals/Pain Pain Assessment Pain Assessment: 0-10 Pain Score: 5  Pain Location: L hip Pain Descriptors / Indicators: Aching, Constant, Operative site guarding Pain Intervention(s): Limited activity within patient's tolerance, Monitored during session, Premedicated before session, Ice applied    Home Living Family/patient expects to be discharged to:: Private residence Living Arrangements: Spouse/significant other Available Help at Discharge: Family Type of Home: House Home Access: Stairs to enter Entrance Stairs-Rails: Right;Left     Home Layout: One level Home Equipment: Grab bars - tub/shower;Shower seat;Cane - single point Additional Comments: pt reports unable to locate RW    Prior Function Prior Level of Function : Independent/Modified Independent             Mobility Comments: mod I with all ADLs, self care tasks, IADLs, amb with SPC in community and no AD in home       Hand Dominance        Extremity/Trunk Assessment        Lower Extremity Assessment Lower Extremity Assessment: LLE deficits/detail LLE Deficits / Details: L ankle Df/PF 5/5 LLE Sensation: WNL    Cervical / Trunk Assessment Cervical / Trunk Assessment:  (scoliosis)  Communication   Communication: No difficulties  Cognition Arousal/Alertness: Awake/alert Behavior During Therapy: WFL for tasks assessed/performed Overall Cognitive Status: Within Functional Limits for tasks assessed                                          General Comments General comments (skin integrity, edema, etc.): pt reports feeling very shakey    Exercises Total Joint Exercises Ankle Circles/Pumps: AROM, Both, 20 reps   Assessment/Plan    PT Assessment Patient needs continued PT services  PT Problem List Decreased strength;Decreased range of motion;Decreased activity tolerance;Decreased  balance;Decreased mobility;Decreased coordination;Pain       PT Treatment Interventions DME instruction;Gait training;Stair training;Functional mobility training;Therapeutic activities;Therapeutic exercise;Balance training;Neuromuscular re-education;Patient/family education;Modalities    PT Goals (Current goals can be found in the Care Plan section)  Acute Rehab PT Goals Patient Stated Goal: to be able twalk again PT Goal Formulation: With patient Time For Goal Achievement: 08/06/22 Potential to Achieve Goals: Good    Frequency 7X/week     Co-evaluation               AM-PAC PT "6 Clicks" Mobility  Outcome Measure Help needed turning from your back to your side while in a flat bed without using bedrails?: A Little Help needed moving from lying on your back to sitting on the side of a flat bed without using bedrails?: A Little Help needed moving to and from a bed to a chair (including a wheelchair)?: A Little Help needed standing up from a chair using your arms (e.g., wheelchair or bedside chair)?: A Little Help needed to walk in hospital room?: A Little Help needed climbing 3-5 steps with a railing? : Total 6 Click Score: 16    End of Session Equipment  Utilized During Treatment: Gait belt Activity Tolerance: Patient limited by fatigue;Patient limited by lethargy;Other (comment) (desaturation on RA) Patient left: in chair;with call bell/phone within reach;with chair alarm set;with nursing/sitter in room Nurse Communication: Mobility status;Other (comment) (lethargy and pt desaturating on RA, nurse assisting pt to don [place] when PT exited room with O2 saturation on RA at rest 88%.) PT Visit Diagnosis: Unsteadiness on feet (R26.81);Other abnormalities of gait and mobility (R26.89);Muscle weakness (generalized) (M62.81);Pain Pain - Right/Left: Left Pain - part of body: Hip    Time: [id number] PT Time Calculation (min) (ACUTE ONLY): 46 min   Charges:   PT Evaluation $PT Eval  Low Complexity: 1 Low PT Treatments $Gait Training: 8-22 mins $Therapeutic Activity: 8-22 mins        Baird Lyons, PT  Adair Patter 07/23/2022, 6:55 PM

## 2022-07-23 NOTE — Transfer of Care (Signed)
Immediate Anesthesia Transfer of Care Note  Patient: Baisley A Bourdeau  Procedure(s) Performed: TOTAL HIP ARTHROPLASTY ANTERIOR APPROACH (Left: Hip)  Patient Location: PACU  Anesthesia Type:General  Level of Consciousness: awake and alert   Airway & Oxygen Therapy: Patient Spontanous Breathing and Patient connected to nasal cannula oxygen  Post-op Assessment: Report given to RN and Post -op Vital signs reviewed and stable  Post vital signs: Reviewed and stable  Last Vitals:  Vitals Value Taken Time  BP 105/61 07/23/22 1316  Temp    Pulse 63 07/23/22 1318  Resp 31 07/23/22 1318  SpO2 94 % 07/23/22 1318  Vitals shown include unvalidated device data.  Last Pain:  Vitals:   07/23/22 0944  TempSrc:   PainSc: 4       Patients Stated Pain Goal: 3 ([id number])  Complications: No notable events documented.

## 2022-07-23 NOTE — Anesthesia Procedure Notes (Signed)
Procedure Name: Intubation Date/Time: 07/23/2022 11:30 AM  Performed by: Timoteo Expose, CRNAPre-anesthesia Checklist: Patient identified, Emergency Drugs available, Suction available and Patient being monitored Patient Re-evaluated:Patient Re-evaluated prior to induction Oxygen Delivery Method: Circle system utilized Preoxygenation: Pre-oxygenation with 100% oxygen Induction Type: IV induction Ventilation: Mask ventilation without difficulty Laryngoscope Size: Mac and 3 Grade View: Grade I Tube type: Oral Tube size: 7.0 mm Number of attempts: 1 Airway Equipment and Method: Stylet Placement Confirmation: ETT inserted through vocal cords under direct vision, positive ETCO2 and breath sounds checked- equal and bilateral Secured at: 21 cm Tube secured with: Tape Dental Injury: Teeth and Oropharynx as per pre-operative assessment  Comments: ATOI

## 2022-07-23 NOTE — Interval H&P Note (Signed)
History and Physical Interval Note:  07/23/2022 10:10 AM  Barbara Singleton  has presented today for surgery, with the diagnosis of Left hip osteoarthritis.  The various methods of treatment have been discussed with the patient and family. After consideration of risks, benefits and other options for treatment, the patient has consented to  Procedure(s): TOTAL HIP ARTHROPLASTY ANTERIOR APPROACH (Left) as a surgical intervention.  The patient's history has been reviewed, patient examined, no change in status, stable for surgery.  I have reviewed the patient's chart and labs.  Questions were answered to the patient's satisfaction.     Mauri Pole

## 2022-07-23 NOTE — Anesthesia Postprocedure Evaluation (Signed)
Anesthesia Post Note  Patient: Barbara Singleton  Procedure(s) Performed: TOTAL HIP ARTHROPLASTY ANTERIOR APPROACH (Left: Hip)     Patient location during evaluation: PACU Anesthesia Type: General Level of consciousness: awake Pain management: pain level controlled Vital Signs Assessment: post-procedure vital signs reviewed and stable Respiratory status: spontaneous breathing, nonlabored ventilation and respiratory function stable Cardiovascular status: blood pressure returned to baseline and stable Postop Assessment: no apparent nausea or vomiting Anesthetic complications: no   No notable events documented.  Last Vitals:  Vitals:   07/23/22 1602 07/23/22 1604  BP: 122/61   Pulse: 71   Resp: 14   Temp:    SpO2: 92% 97%    Last Pain:  Vitals:   07/23/22 1632  TempSrc:   PainSc: 6                  Aviyon Hocevar P Afreen Siebels

## 2022-07-24 ENCOUNTER — Encounter (HOSPITAL_COMMUNITY): Payer: Self-pay | Admitting: Orthopedic Surgery

## 2022-07-24 DIAGNOSIS — Z8673 Personal history of transient ischemic attack (TIA), and cerebral infarction without residual deficits: Secondary | ICD-10-CM | POA: Diagnosis not present

## 2022-07-24 DIAGNOSIS — Z79899 Other long term (current) drug therapy: Secondary | ICD-10-CM | POA: Diagnosis not present

## 2022-07-24 DIAGNOSIS — Z96642 Presence of left artificial hip joint: Secondary | ICD-10-CM | POA: Diagnosis not present

## 2022-07-24 DIAGNOSIS — Z7902 Long term (current) use of antithrombotics/antiplatelets: Secondary | ICD-10-CM | POA: Diagnosis not present

## 2022-07-24 DIAGNOSIS — Z96651 Presence of right artificial knee joint: Secondary | ICD-10-CM | POA: Diagnosis not present

## 2022-07-24 DIAGNOSIS — M1612 Unilateral primary osteoarthritis, left hip: Secondary | ICD-10-CM | POA: Diagnosis not present

## 2022-07-24 LAB — BASIC METABOLIC PANEL
Anion gap: 9 (ref 5–15)
BUN: 14 mg/dL (ref 8–23)
CO2: 26 mmol/L (ref 22–32)
Calcium: 8.7 mg/dL — ABNORMAL LOW (ref 8.9–10.3)
Chloride: 103 mmol/L (ref 98–111)
Creatinine, Ser: 0.66 mg/dL (ref 0.44–1.00)
GFR, Estimated: >60 mL/min (ref >60)
Glucose, Bld: 124 mg/dL — ABNORMAL HIGH (ref 70–99)
Potassium: 4.3 mmol/L (ref 3.5–5.1)
Sodium: 138 mmol/L (ref 135–145)

## 2022-07-24 LAB — CBC
HCT: 31.3 % — ABNORMAL LOW (ref 36.0–46.0)
Hemoglobin: 10 g/dL — ABNORMAL LOW (ref 12.0–15.0)
MCH: 30.7 pg (ref 26.0–34.0)
MCHC: 31.9 g/dL (ref 30.0–36.0)
MCV: 96 fL (ref 80.0–100.0)
Platelets: 151 10*3/uL (ref 150–400)
RBC: 3.26 MIL/uL — ABNORMAL LOW (ref 3.87–5.11)
RDW: 13.7 % (ref 11.5–15.5)
WBC: 10.4 10*3/uL (ref 4.0–10.5)
nRBC: 0 % (ref 0.0–0.2)

## 2022-07-24 MED ORDER — METHOCARBAMOL 500 MG PO TABS: 500.0000 mg | ORAL_TABLET | Freq: Four times a day (QID) | ORAL | 2 refills | Status: AC | PRN

## 2022-07-24 MED ORDER — POLYETHYLENE GLYCOL 3350 17 G PO PACK: 17.0000 g | PACK | Freq: Two times a day (BID) | ORAL | 0 refills | Status: AC

## 2022-07-24 MED ORDER — OXYCODONE HCL 5 MG PO TABS: 5.0000 mg | ORAL_TABLET | ORAL | 0 refills | Status: AC | PRN

## 2022-07-24 MED ORDER — SENNA 8.6 MG PO TABS: 2.0000 | ORAL_TABLET | Freq: Every day | ORAL | 0 refills | Status: AC

## 2022-07-24 MED ORDER — PHENYLEPHRINE HCL-NACL 20-0.9 MG/250ML-% IV SOLN
INTRAVENOUS | Status: AC
  Filled 2022-07-24: qty 250

## 2022-07-24 MED ORDER — ASPIRIN 81 MG PO CHEW: 81.0000 mg | CHEWABLE_TABLET | Freq: Two times a day (BID) | ORAL | 0 refills | Status: AC

## 2022-07-24 NOTE — Progress Notes (Signed)
   Subjective: 1 Day Post-Op Procedure(s) (LRB): TOTAL HIP ARTHROPLASTY ANTERIOR APPROACH (Left) Patient reports pain as mild.   Patient seen in rounds for Dr. Alvan Dame. Patient is resting in bed on exam this morning. Patient tells me she had severe pain in PACU yesterday ,but this has since improved. She had an episode of hypoxia to 88% with PT yesterday, but denies SHOB, cough, chest pain currently. No acute events overnight.  We will continue therapy today.   Objective: Vital signs in last 24 hours: Temp:  [97.6 F (36.4 C)-98.2 F (36.8 C)] 97.7 F (36.5 C) (03/13 0528) Pulse Rate:  [61-77] 61 (03/13 0528) Resp:  [11-25] 16 (03/13 0528) BP: (105-123)/(48-78) 107/67 (03/13 0528) SpO2:  [86 %-100 %] 99 % (03/13 0528) Weight:  [68 kg] 68 kg (03/12 0944)  Intake/Output from previous day:  Intake/Output Summary (Last 24 hours) at 07/24/2022 0823 Last data filed at 07/24/2022 0726 Gross per 24 hour  Intake 2146.23 ml  Output 1200 ml  Net 946.23 ml     Intake/Output this shift: Total I/O In: 156.2 [I.V.:156.2] Out: -   Labs: Recent Labs    07/24/22 0343  HGB 10.0*   Recent Labs    07/24/22 0343  WBC 10.4  RBC 3.26*  HCT 31.3*  PLT 151   Recent Labs    07/24/22 0343  NA 138  K 4.3  CL 103  CO2 26  BUN 14  CREATININE 0.66  GLUCOSE 124*  CALCIUM 8.7*   No results for input(s): "LABPT", "INR" in the last 72 hours.  Exam: General - Patient is Alert and Oriented Extremity - Neurologically intact Sensation intact distally Intact pulses distally Dorsiflexion/Plantar flexion intact Dressing - dressing C/D/I Motor Function - intact, moving foot and toes well on exam.   Past Medical History:  Diagnosis Date   Anxiety    Arthritis    Osteoarthritis- knees.   Depression    Multiple sclerosis ([place])    Neuromuscular disorder ([place])    Mulitple sclerosis   Stroke ([place]) 11/14/2020   Thalmic CVA    Assessment/Plan: 1 Day Post-Op Procedure(s) (LRB): TOTAL  HIP ARTHROPLASTY ANTERIOR APPROACH (Left) Principal Problem:   S/P total left hip arthroplasty  Estimated body mass index is 29.29 kg/m as calculated from the following:   Height as of this encounter: 5' (1.524 m).   Weight as of this encounter: 68 kg. Advance diet Up with therapy D/C IV fluids  DVT Prophylaxis - Aspirin Weight bearing as tolerated.  Hgb stable at 10 this AM.  Plan is to go Home after hospital stay. Plan for discharge today after meeting goals with therapy. Follow up in the office in 2 weeks.   Griffith Citron, PA-C Orthopedic Surgery [phone] 07/24/2022, 8:23 AM

## 2022-07-24 NOTE — Progress Notes (Signed)
Patient discharged to home w/ family. Given all belongings, instruction, equipment. Verbalized understanding of instructions. Escorted to pov via w/c.

## 2022-07-24 NOTE — Progress Notes (Signed)
Physical Therapy Treatment Patient Details Name: Barbara Singleton MRN: [id number] DOB: 01-07-1951 Today's Date: 07/24/2022   History of Present Illness 72 yo female presents to therapy s/p L THA, anterior approach on 07/23/2022 due to failure of conservative measures. Pt PMH includes but is not limited to: CVA, R TKA (2017), MS, and lumbar fusion.    PT Comments    Pt is POD # 1 and is progressing well.  She ambulated 100' with RW and performed 5 steps with bil rails to simulate home environment.  Pt with good pain control and tolerating mobility well.  Pt demonstrates safe gait & transfers in order to return home from PT perspective once discharged by MD.  While in hospital, will continue to benefit from PT for skilled therapy to advance mobility and exercises.      Recommendations for follow up therapy are one component of a multi-disciplinary discharge planning process, led by the attending physician.  Recommendations may be updated based on patient status, additional functional criteria and insurance authorization.  Follow Up Recommendations  Follow physician's recommendations for discharge plan and follow up therapies     Assistance Recommended at Discharge Frequent or constant Supervision/Assistance  Patient can return home with the following A little help with walking and/or transfers;A little help with bathing/dressing/bathroom;Assistance with cooking/housework;Assist for transportation;Help with stairs or ramp for entrance   Equipment Recommendations  Rolling walker (2 wheels) (youth)    Recommendations for Other Services       Precautions / Restrictions Precautions Precautions: Fall Restrictions Weight Bearing Restrictions: Yes LLE Weight Bearing: Weight bearing as tolerated     Mobility  Bed Mobility Overal bed mobility: Needs Assistance Bed Mobility: Supine to Sit     Supine to sit: Supervision, HOB elevated     General bed mobility comments: performed without  difficulty or assist    Transfers Overall transfer level: Needs assistance Equipment used: Rolling walker (2 wheels) Transfers: Sit to/from Stand Sit to Stand: Min guard, Supervision           General transfer comment: Performed x 3 during session (from bed, chair, toilet).  Started with min guard progressing to supervision.  Demonstrated safely    Ambulation/Gait Ambulation/Gait assistance: Min guard, Supervision Gait Distance (Feet): 100 Feet (100', 12', 30') Assistive device: Rolling walker (2 wheels) Gait Pattern/deviations: Step-through pattern, Decreased stride length, Decreased weight shift to left Gait velocity: decreased     General Gait Details: Pt with hx of scoloisis and leans to R side baseline.  She was able to ambulate 100', then 12' (bathroom to chair), then 30' for stair trial.  Pt tolerating well with steady gait and only slight decrease in weight shift to L . Pt with good RW proximity without cues.  She had no SOB today and sats were 95% with activity and 98% rest on RA   Stairs Stairs: Yes Stairs assistance: Min guard Stair Management: Two rails, Step to pattern, Forwards Number of Stairs: 5 General stair comments: Educated on sequencing and pt was able to go up/down 5 steps with min guard for safety.  Pt has bil rails at home   Wheelchair Mobility    Modified Rankin (Stroke Patients Only)       Balance Overall balance assessment: Needs assistance Sitting-balance support: Feet supported Sitting balance-Leahy Scale: Good     Standing balance support: Bilateral upper extremity supported, No upper extremity supported Standing balance-Leahy Scale: Fair Standing balance comment: RW to ambulate but was steady.  Pt also able  to perform static stand with ADLs without UE support                            Cognition Arousal/Alertness: Awake/alert Behavior During Therapy: WFL for tasks assessed/performed Overall Cognitive Status: Within  Functional Limits for tasks assessed                                          Exercises Total Joint Exercises Ankle Circles/Pumps: AROM, Both, 10 reps, Supine Quad Sets: AROM, Both, 10 reps, Supine Heel Slides: AROM, Both, 10 reps, Supine (educated on AAROM with belt if needed and for pain control; tolerated well) Hip ABduction/ADduction: AROM, 5 reps, Supine, AAROM (Pt had increased pain, educated on AAROM with belt but still painful so only did 5 reps) Long Arc Quad: AROM, Both, 10 reps, Seated Other Exercises Other Exercises: Pt tolerated all well except supine abduction so limited reps Other Exercises: Also performed 2 reps each standing L hip flex, abd, ext, and HS curl holding rail with min guard.  Instructed to add to HEP starting next week with supervision    General Comments General comments (skin integrity, edema, etc.): VSS on RA Educated on safe ice use, no pivots, car transfers, and TED hose during day. Also, encouraged walking every 1-2 hours during day. Educated on HEP with focus on mobility the first weeks. Discussed doing exercises within pain control and if pain increasing could decreased ROM, reps, and stop exercises as needed. For standing exercises written instruction to start next week and with supervision. Encouraged to perform  ankle pumps frequently for blood flow .      Pertinent Vitals/Pain Pain Assessment Pain Assessment: 0-10 Pain Score: 3  Pain Location: L hip Pain Descriptors / Indicators: Aching, Constant, Operative site guarding Pain Intervention(s): Limited activity within patient's tolerance, Monitored during session, Premedicated before session, Ice applied    Home Living                          Prior Function            PT Goals (current goals can now be found in the care plan section) Progress towards PT goals: Progressing toward goals    Frequency    7X/week      PT Plan Current plan remains appropriate     Co-evaluation              AM-PAC PT "6 Clicks" Mobility   Outcome Measure  Help needed turning from your back to your side while in a flat bed without using bedrails?: A Little Help needed moving from lying on your back to sitting on the side of a flat bed without using bedrails?: A Little Help needed moving to and from a bed to a chair (including a wheelchair)?: A Little Help needed standing up from a chair using your arms (e.g., wheelchair or bedside chair)?: A Little Help needed to walk in hospital room?: A Little Help needed climbing 3-5 steps with a railing? : A Little 6 Click Score: 18    End of Session Equipment Utilized During Treatment: Gait belt Activity Tolerance: Patient tolerated treatment well Patient left: in chair;with chair alarm set;with call bell/phone within reach Nurse Communication: Mobility status PT Visit Diagnosis: Unsteadiness on feet (R26.81);Other abnormalities of gait and mobility (R26.89);Muscle weakness (  generalized) (M62.81);Pain Pain - Right/Left: Left Pain - part of body: Hip     Time: [id number] PT Time Calculation (min) (ACUTE ONLY): 31 min  Charges:  $Gait Training: 8-22 mins $Therapeutic Exercise: 8-22 mins                     Abran Richard, PT Acute Rehab [facility] [phone]    Karlton Lemon 07/24/2022, 10:46 AM

## 2022-07-24 NOTE — TOC Transition Note (Signed)
Transition of Care [facility]) - CM/SW Discharge Note   Patient Details  Name: Barbara Singleton MRN: [id number] Date of Birth: 21-Apr-1951  [facility]) CM/SW Contact:  Lennart Pall, LCSW Phone Number: 07/24/2022, 9:55 AM   Clinical Narrative:    Met with pt and confirming she has received youth RW to room via [place].  Plan for HEP.  No further TOC needs.   Final next level of care: Home/Self Care Barriers to Discharge: No Barriers Identified   Patient Goals and CMS Choice      Discharge Placement                         Discharge Plan and Services Additional resources added to the After Visit Summary for                  DME Arranged: Walker rolling DME Agency: [place]                  Social Determinants of Health (SDOH) Interventions SDOH Screenings   Food Insecurity: No Food Insecurity (07/23/2022)  Housing: Low Risk  (07/23/2022)  Transportation Needs: No Transportation Needs (07/23/2022)  Utilities: Not At Risk (07/23/2022)  Depression (PHQ2-9): Low Risk  (12/25/2020)  Tobacco Use: Medium Risk (07/24/2022)     Readmission Risk Interventions     No data to display

## 2022-07-30 ENCOUNTER — Encounter: Payer: Self-pay | Admitting: Neurology

## 2022-07-31 ENCOUNTER — Other Ambulatory Visit: Payer: Self-pay | Admitting: Neurology

## 2022-07-31 MED ORDER — METHYLPHENIDATE HCL 20 MG PO TABS: 20.0000 mg | ORAL_TABLET | Freq: Every day | ORAL | 0 refills | Status: DC

## 2022-08-01 NOTE — Discharge Summary (Signed)
Patient ID: Barbara Singleton MRN: [id number] DOB/AGE: 1950/06/22 72 y.o.  Admit date: 07/23/2022 Discharge date: 07/24/2022  Admission Diagnoses:  Left hip osteoarthritis  Discharge Diagnoses:  Principal Problem:   S/P total left hip arthroplasty   Past Medical History:  Diagnosis Date   Anxiety    Arthritis    Osteoarthritis- knees.   Depression    Multiple sclerosis ([place])    Neuromuscular disorder ([place])    Mulitple sclerosis   Stroke ([place]) 11/14/2020   Thalmic CVA    Surgeries: Procedure(s): TOTAL HIP ARTHROPLASTY ANTERIOR APPROACH on 07/23/2022   Consultants:   Discharged Condition: Improved  Hospital Course: Barbara Singleton is an 72 y.o. female who was admitted 07/23/2022 for operative treatment ofS/P total left hip arthroplasty. Patient has severe unremitting pain that affects sleep, daily activities, and work/hobbies. After pre-op clearance the patient was taken to the operating room on 07/23/2022 and underwent  Procedure(s): TOTAL HIP ARTHROPLASTY ANTERIOR APPROACH.    Patient was given perioperative antibiotics:  Anti-infectives (From admission, onward)    Start     Dose/Rate Route Frequency Ordered Stop   07/23/22 1800  ceFAZolin (ANCEF) IVPB 2g/100 mL premix        2 g 200 mL/hr over 30 Minutes Intravenous Every 6 hours 07/23/22 1600 07/24/22 0053   07/23/22 0915  ceFAZolin (ANCEF) IVPB 2g/100 mL premix        2 g 200 mL/hr over 30 Minutes Intravenous On call to O.R. 07/23/22 0901 07/23/22 1134        Patient was given sequential compression devices, early ambulation, and chemoprophylaxis to prevent DVT. Patient worked with PT and was meeting their goals regarding safe ambulation and transfers.  Patient benefited maximally from hospital stay and there were no complications.    Recent vital signs: No data found.   Recent laboratory studies: No results for input(s): "WBC", "HGB", "HCT", "PLT", "NA", "K", "CL", "CO2", "BUN", "CREATININE", "GLUCOSE", "INR",  "CALCIUM" in the last 72 hours.  Invalid input(s): "PT", "2"   Discharge Medications:   Allergies as of 07/24/2022       Reactions   Naproxen Other (See Comments)   HEADACHE   Other Anxiety   Antihistamines cause anxiety and jitteriness         Medication List     TAKE these medications    acetaminophen 500 MG tablet Commonly known as: TYLENOL Take 1,000 mg by mouth every 6 (six) hours as needed for moderate pain.   aspirin 81 MG chewable tablet Chew 1 tablet (81 mg total) by mouth 2 (two) times daily for 28 days.   atorvastatin 40 MG tablet Commonly known as: LIPITOR Take 1 tablet (40 mg total) by mouth daily.   b complex vitamins capsule Take 1 capsule by mouth daily.   CALCIUM 600 + D PO Take 1-2 tablets by mouth See admin instructions. Take 2 tablets in the morning and 1 tablet at night   clobetasol 0.05 % topical foam Commonly known as: OLUX Apply topically 2 (two) times daily. What changed:  how much to take when to take this reasons to take this   clopidogrel 75 MG tablet Commonly known as: PLAVIX Take 1 tablet (75 mg total) by mouth daily.   fluvoxaMINE 100 MG tablet Commonly known as: LUVOX Take 100 mg by mouth 3 (three) times daily.   lamoTRIgine 100 MG tablet Commonly known as: LAMICTAL Take 100 mg by mouth 2 (two) times daily.   Latuda 80 MG Tabs tablet Generic  drug: lurasidone Take 80 mg by mouth daily.   methocarbamol 500 MG tablet Commonly known as: ROBAXIN Take 1 tablet (500 mg total) by mouth every 6 (six) hours as needed for muscle spasms.   multivitamin with minerals Tabs tablet Take 1 tablet by mouth daily.   oxybutynin 5 MG tablet Commonly known as: DITROPAN Take 1 tablet (5 mg total) by mouth at bedtime.   oxyCODONE 5 MG immediate release tablet Commonly known as: Oxy IR/ROXICODONE Take 1 tablet (5 mg total) by mouth every 4 (four) hours as needed for severe pain.   polyethylene glycol 17 g packet Commonly known as:  MIRALAX / GLYCOLAX Take 17 g by mouth 2 (two) times daily.   senna 8.6 MG Tabs tablet Commonly known as: SENOKOT Take 2 tablets (17.2 mg total) by mouth at bedtime for 14 days.   traZODone 100 MG tablet Commonly known as: DESYREL Take 100 mg by mouth at bedtime.   vitamin C 1000 MG tablet Take 1,000 mg by mouth daily.               Discharge Care Instructions  (From admission, onward)           Start     Ordered   07/24/22 0000  Change dressing       Comments: Maintain surgical dressing until follow up in the clinic. If the edges start to pull up, may reinforce with tape. If the dressing is no longer working, may remove and cover with gauze and tape, but must keep the area dry and clean.  Call with any questions or concerns.   07/24/22 1140            Diagnostic Studies: DG Pelvis Portable  Result Date: 07/23/2022 CLINICAL DATA:  Postoperative left hip. EXAM: PORTABLE PELVIS 1-2 VIEWS COMPARISON:  Intraoperative fluoroscopy left hip 07/23/2022. FINDINGS: Status post recent total left hip arthroplasty. No perihardware lucency is seen to indicate hardware failure or loosening. Expected subcutaneous air around the left hip and left groin. Mild right femoroacetabular joint space narrowing. Moderate bilateral sacroiliac subchondral sclerosis. No acute fracture or dislocation. IMPRESSION: Status post total left hip arthroplasty without evidence of hardware failure. Electronically Signed   By: Yvonne Kendall M.D.   On: 07/23/2022 15:16   DG HIP UNILAT WITH PELVIS 2-3 VIEWS LEFT  Result Date: 07/23/2022 CLINICAL DATA:  [id number] Surgery, elective [id number] EXAM: DG HIP (WITH OR WITHOUT PELVIS) 2-3V LEFT COMPARISON:  None Available. FINDINGS: Intraoperative images during left total hip arthroplasty. Normal alignment. IMPRESSION: Intraoperative images during left total hip arthroplasty. Normal alignment. Electronically Signed   By: Maurine Simmering M.D.   On: 07/23/2022 12:56   DG C-Arm  1-60 Min-No Report  Result Date: 07/23/2022 Fluoroscopy was utilized by the requesting physician.  No radiographic interpretation.   DG C-Arm 1-60 Min-No Report  Result Date: 07/23/2022 Fluoroscopy was utilized by the requesting physician.  No radiographic interpretation.    Disposition: Discharge disposition: 01-Home or Self Care       Discharge Instructions     Call MD / Call 911   Complete by: As directed    If you experience chest pain or shortness of breath, CALL 911 and be transported to the hospital emergency room.  If you develope a fever above 101 F, pus (white drainage) or increased drainage or redness at the wound, or calf pain, call your surgeon's office.   Change dressing   Complete by: As directed    Maintain surgical dressing  until follow up in the clinic. If the edges start to pull up, may reinforce with tape. If the dressing is no longer working, may remove and cover with gauze and tape, but must keep the area dry and clean.  Call with any questions or concerns.   Constipation Prevention   Complete by: As directed    Drink plenty of fluids.  Prune juice may be helpful.  You may use a stool softener, such as Colace (over the counter) 100 mg twice a day.  Use MiraLax (over the counter) for constipation as needed.   Diet - low sodium heart healthy   Complete by: As directed    Increase activity slowly as tolerated   Complete by: As directed    Weight bearing as tolerated with assist device (walker, cane, etc) as directed, use it as long as suggested by your surgeon or therapist, typically at least 4-6 weeks.   Post-operative opioid taper instructions:   Complete by: As directed    POST-OPERATIVE OPIOID TAPER INSTRUCTIONS: It is important to wean off of your opioid medication as soon as possible. If you do not need pain medication after your surgery it is ok to stop day one. Opioids include: Codeine, Hydrocodone(Norco, Vicodin), Oxycodone(Percocet, oxycontin) and  hydromorphone amongst others.  Long term and even short term use of opiods can cause: Increased pain response Dependence Constipation Depression Respiratory depression And more.  Withdrawal symptoms can include Flu like symptoms Nausea, vomiting And more Techniques to manage these symptoms Hydrate well Eat regular healthy meals Stay active Use relaxation techniques(deep breathing, meditating, yoga) Do Not substitute Alcohol to help with tapering If you have been on opioids for less than two weeks and do not have pain than it is ok to stop all together.  Plan to wean off of opioids This plan should start within one week post op of your joint replacement. Maintain the same interval or time between taking each dose and first decrease the dose.  Cut the total daily intake of opioids by one tablet each day Next start to increase the time between doses. The last dose that should be eliminated is the evening dose.      TED hose   Complete by: As directed    Use stockings (TED hose) for 2 weeks on both leg(s).  You may remove them at night for sleeping.        Follow-up Information     Paralee Cancel, MD. Go on 08/07/2022.   Specialty: Orthopedic Surgery Why: You are scheduled for first post op appt on Wednesday March 27 at 10:45am. Contact information: [address] [place] [address] [phone]                  Signed: Irving Copas 08/01/2022, 7:16 AM

## 2022-08-07 DIAGNOSIS — Z96642 Presence of left artificial hip joint: Secondary | ICD-10-CM | POA: Diagnosis not present

## 2022-08-07 DIAGNOSIS — Z471 Aftercare following joint replacement surgery: Secondary | ICD-10-CM | POA: Diagnosis not present

## 2022-08-21 DIAGNOSIS — Z96642 Presence of left artificial hip joint: Secondary | ICD-10-CM | POA: Diagnosis not present

## 2022-08-21 DIAGNOSIS — Z471 Aftercare following joint replacement surgery: Secondary | ICD-10-CM | POA: Diagnosis not present

## 2022-08-26 DIAGNOSIS — M6281 Muscle weakness (generalized): Secondary | ICD-10-CM | POA: Diagnosis not present

## 2022-08-27 DIAGNOSIS — M6281 Muscle weakness (generalized): Secondary | ICD-10-CM | POA: Insufficient documentation

## 2022-08-28 ENCOUNTER — Other Ambulatory Visit: Payer: Self-pay | Admitting: Neurology

## 2022-08-28 MED ORDER — METHYLPHENIDATE HCL 20 MG PO TABS: 20.0000 mg | ORAL_TABLET | Freq: Every day | ORAL | 0 refills | Status: DC

## 2022-08-28 NOTE — Telephone Encounter (Signed)
Last seen on 05/01/22 per note "Continue Ritalin." Follow up scheduled on 10/31/22 Last filled on 07/31/22 # 30 tablet ( 30 day supply) Rx pending to be signed

## 2022-08-29 DIAGNOSIS — F429 Obsessive-compulsive disorder, unspecified: Secondary | ICD-10-CM | POA: Diagnosis not present

## 2022-08-29 DIAGNOSIS — F331 Major depressive disorder, recurrent, moderate: Secondary | ICD-10-CM | POA: Diagnosis not present

## 2022-08-29 DIAGNOSIS — F419 Anxiety disorder, unspecified: Secondary | ICD-10-CM | POA: Diagnosis not present

## 2022-09-03 DIAGNOSIS — Z8673 Personal history of transient ischemic attack (TIA), and cerebral infarction without residual deficits: Secondary | ICD-10-CM | POA: Diagnosis not present

## 2022-09-03 DIAGNOSIS — R7303 Prediabetes: Secondary | ICD-10-CM | POA: Diagnosis not present

## 2022-09-03 DIAGNOSIS — F331 Major depressive disorder, recurrent, moderate: Secondary | ICD-10-CM | POA: Diagnosis not present

## 2022-09-03 DIAGNOSIS — G35 Multiple sclerosis: Secondary | ICD-10-CM | POA: Diagnosis not present

## 2022-09-03 DIAGNOSIS — M8580 Other specified disorders of bone density and structure, unspecified site: Secondary | ICD-10-CM | POA: Diagnosis not present

## 2022-09-03 DIAGNOSIS — Z0000 Encounter for general adult medical examination without abnormal findings: Secondary | ICD-10-CM | POA: Diagnosis not present

## 2022-09-03 DIAGNOSIS — F429 Obsessive-compulsive disorder, unspecified: Secondary | ICD-10-CM | POA: Diagnosis not present

## 2022-09-03 DIAGNOSIS — E7800 Pure hypercholesterolemia, unspecified: Secondary | ICD-10-CM | POA: Diagnosis not present

## 2022-09-03 DIAGNOSIS — M1990 Unspecified osteoarthritis, unspecified site: Secondary | ICD-10-CM | POA: Diagnosis not present

## 2022-09-04 DIAGNOSIS — M6281 Muscle weakness (generalized): Secondary | ICD-10-CM | POA: Diagnosis not present

## 2022-09-06 DIAGNOSIS — M6281 Muscle weakness (generalized): Secondary | ICD-10-CM | POA: Diagnosis not present

## 2022-09-16 ENCOUNTER — Ambulatory Visit: Payer: Medicare PPO | Admitting: Neurology

## 2022-09-17 DIAGNOSIS — M6281 Muscle weakness (generalized): Secondary | ICD-10-CM | POA: Diagnosis not present

## 2022-09-19 DIAGNOSIS — M6281 Muscle weakness (generalized): Secondary | ICD-10-CM | POA: Diagnosis not present

## 2022-09-24 DIAGNOSIS — M6281 Muscle weakness (generalized): Secondary | ICD-10-CM | POA: Diagnosis not present

## 2022-09-26 DIAGNOSIS — M6281 Muscle weakness (generalized): Secondary | ICD-10-CM | POA: Diagnosis not present

## 2022-10-02 DIAGNOSIS — Z471 Aftercare following joint replacement surgery: Secondary | ICD-10-CM | POA: Diagnosis not present

## 2022-10-02 DIAGNOSIS — Z96642 Presence of left artificial hip joint: Secondary | ICD-10-CM | POA: Diagnosis not present

## 2022-10-04 ENCOUNTER — Other Ambulatory Visit: Payer: Self-pay | Admitting: Neurology

## 2022-10-08 MED ORDER — METHYLPHENIDATE HCL 20 MG PO TABS: 20.0000 mg | ORAL_TABLET | Freq: Every day | ORAL | 0 refills | Status: DC

## 2022-10-08 NOTE — Telephone Encounter (Signed)
Last seen on 05/01/22  Follow up scheduled on 10/31/22 Last filled on 08/31/22 # 30 tablets (30 day supply) Rx pending to be signed

## 2022-10-31 ENCOUNTER — Telehealth: Payer: Self-pay | Admitting: Neurology

## 2022-10-31 ENCOUNTER — Encounter: Payer: Self-pay | Admitting: Neurology

## 2022-10-31 ENCOUNTER — Ambulatory Visit: Payer: Medicare PPO | Admitting: Neurology

## 2022-10-31 VITALS — BP 116/68 | HR 97 | Ht 59.0 in | Wt 138.5 lb

## 2022-10-31 DIAGNOSIS — R200 Anesthesia of skin: Secondary | ICD-10-CM

## 2022-10-31 DIAGNOSIS — R41840 Attention and concentration deficit: Secondary | ICD-10-CM

## 2022-10-31 DIAGNOSIS — I639 Cerebral infarction, unspecified: Secondary | ICD-10-CM

## 2022-10-31 DIAGNOSIS — I6381 Other cerebral infarction due to occlusion or stenosis of small artery: Secondary | ICD-10-CM | POA: Diagnosis not present

## 2022-10-31 DIAGNOSIS — R269 Unspecified abnormalities of gait and mobility: Secondary | ICD-10-CM | POA: Diagnosis not present

## 2022-10-31 DIAGNOSIS — G35 Multiple sclerosis: Secondary | ICD-10-CM

## 2022-10-31 MED ORDER — METHYLPHENIDATE HCL 20 MG PO TABS: 20.0000 mg | ORAL_TABLET | Freq: Every day | ORAL | 0 refills | Status: DC

## 2022-10-31 NOTE — Progress Notes (Signed)
[facility]  PATIENT: Barbara Singleton DOB: 02/01/1951  REFERRING DOCTOR OR PCP: Mike Gip, DO [facility] neurology); Blair Heys MD (PCP) SOURCE: Patient, notes from [facility] neurology, imaging and lab reports, MRI images personally reviewed.  _________________________________   HISTORICAL  CHIEF COMPLAINT:  Chief Complaint  Patient presents with   Follow-up    Pt in room 10. Here for MS follow up. Pt reports doing well, no recent falls. Hip replacement 3 months ago. No concerns.     HISTORY OF PRESENT ILLNESS:  [name] is a 72 y.o. woman with multiple sclerosis.  UPDATE 10/31/2022 She had been on Ocrevus (last infusion was January 2023 but at the last visit we had decided to see how she does off of a disease modifying therapy as the risks may be higher than the benefit.  She has been diagnosed for more than 20 years and has had no exacerbations and her last several MRIs showed no new lesions.  She feels she is doing just as well of the medication as she did on the medication.  She had a hip replacement 3 months ago and has less pain and feels gait is a little better.  She also has scoliosis.  With her cane she easily walks one hour each day close to 3 miles.      Balance is reduced .  She uses a treadmill and goes 45 minutes    She feels balance affects gait more than strength and legs feel strong and are not spastic.     She has right arm and leg numbness that is stable.    Some tingling but no pain.        Her bladder function is about the same with mild nocturia and frequency.  Oxybutynin was associated with her incontinence doing worse.She denies hesitancy.        She feels she completely recovered from her stroke.   At the time she had diplopia and more right sided symptoms.    Vision is doing well.  No further diplopia.  She denies any major issues with her cognition.    She has some depression and is stable on medication (on Luvox,  lamotrigine, Latuda).    She sleeps well most nights (takes trazodone).   She has some fatigue but is fine overall.   The Ritalin greatly helps her to function better and she  feels more alert and less fatigued.      History of MS: She was diagnosed with MS in 2001 after presenting with numbness in her arms, weakness in legs and multiple falls.  She reports having an MRI but is unsure if just the brain was done on the brain or spine was performed.  She also had a lumbar puncture but we do not have those results.  At the time she was seeing Dr. Leotis Shames.     She was initially placed on Copaxone and stayed on it until 2021.   At that time, she was felt to have more lesions on her MRI and was started on Ocrevus.   She was not having any exacerbations or major change in disability.      History of stroke: She presented to the emergency room 11/14/2020 for right-sided ataxia and gait disturbance.  At that time, she was on aspirin.  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  CT angiogram of the head and neck was negative.  Ejection fraction was  55 to 60%.  LDL was 90.  She was placed on DAPT (Plavix plus aspirin) and then converted to Plavix alone after several weeks.  Her atorvastatin dose was increased from 10 mg to 40 mg.  She recovered almost completely while on the hospital and was back to baseline a few days later.     DATA Imaging: MRI of the head 11/14/2020 shows restricted diffusion consistent with an acute left medial thalamic infarction.  Additionally, scattered throughout the hemispheres on T2/FLAIR hyperintense foci in the periventricular, subcortical and deep white matter.  These are fairly nonspecific and could represent demyelination and/or chronic microvascular ischemic change.   I compared the MRI from 2022 to the MRI from 02/15/2008.  There are more white matter changes during that 13-year interim though the pattern is nonspecific and many of the changes  could be due to chronic microvascular ischemic disease  MRI brain 11/04/2021 showed scattered T2/FLAIR hyperintense foci in the subcortical and deep white matter of the cerebral hemispheres. A few foci are in the periventricular white matter. None of the foci appears to be acute and there are no new lesions compared to the 2022 MRI. Although some foci could be consistent with demyelination. The majority have an appearance more consistent with stable chronic microvascular ischemic change.  1 small focus in the medial left thalamus could be a small lacunar infarction.  MRI cervical spine 11/04/2021 showed that the spinal cord was  normal.  No demyelinating plaques noted.      At C3-C4, there are degenerative changes and very minimal retrolisthesis causing moderate right foraminal narrowing but no spinal stenosis or nerve root compression.   At C4-C5, there are degenerative changes causing borderline spinal stenosis and moderately severe left and moderate right foraminal narrowing.  There is potential for left C5 nerve root compression.   At C5-C6, there are degenerative changes causing minimal spinal stenosis and mild to moderate foraminal narrowing but no nerve root compression.  CT angiogram of the head and neck showed no stenosis.  Incidental note is made of a small vertebrobasilar system with fetal origins of both posterior cerebral arteries.  This is normal variant.  REVIEW OF SYSTEMS: Constitutional: No fevers, chills, sweats, or change in appetite Eyes: No visual changes, double vision, eye pain Ear, nose and throat: No hearing loss, ear pain, nasal congestion, sore throat Cardiovascular: No chest pain, palpitations Respiratory:  No shortness of breath at rest or with exertion.   No wheezes GastrointestinaI: No nausea, vomiting, diarrhea, abdominal pain, fecal incontinence Genitourinary:  No dysuria, urinary retention or frequency.  No nocturia. Musculoskeletal:  No neck pain, back  pain Integumentary: No rash, pruritus, skin lesions Neurological: as above Psychiatric: No depression at this time.  No anxiety Endocrine: No palpitations, diaphoresis, change in appetite, change in weigh or increased thirst Hematologic/Lymphatic:  No anemia, purpura, petechiae. Allergic/Immunologic: No itchy/runny eyes, nasal congestion, recent allergic reactions, rashes  ALLERGIES: Allergies  Allergen Reactions   Naproxen Other (See Comments)    HEADACHE    Other Anxiety    Antihistamines cause anxiety and jitteriness     HOME MEDICATIONS:  Current Outpatient Medications:    acetaminophen (TYLENOL) 500 MG tablet, Take 1,000 mg by mouth every 6 (six) hours as needed for moderate pain., Disp: , Rfl:    Ascorbic Acid (VITAMIN C) 1000 MG tablet, Take 1,000 mg by mouth daily., Disp: , Rfl:    atorvastatin (LIPITOR) 40 MG tablet, Take 1 tablet (40 mg total) by mouth daily., Disp:  30 tablet, Rfl: 0   b complex vitamins capsule, Take 1 capsule by mouth daily., Disp: , Rfl:    Calcium Carb-Cholecalciferol (CALCIUM 600 + D PO), Take 1-2 tablets by mouth See admin instructions. Take 2 tablets in the morning and 1 tablet at night, Disp: , Rfl:    clobetasol (OLUX) 0.05 % topical foam, Apply topically 2 (two) times daily. (Patient taking differently: Apply 1 Application topically 2 (two) times daily as needed (psoriasis).), Disp: 50 g, Rfl: 6   clopidogrel (PLAVIX) 75 MG tablet, Take 1 tablet (75 mg total) by mouth daily., Disp: 30 tablet, Rfl: 0   fluvoxaMINE (LUVOX) 100 MG tablet, Take 100 mg by mouth 3 (three) times daily. , Disp: , Rfl:    lamoTRIgine (LAMICTAL) 100 MG tablet, Take 100 mg by mouth 2 (two) times daily. , Disp: , Rfl:    LATUDA 80 MG TABS tablet, Take 80 mg by mouth daily., Disp: , Rfl:    methocarbamol (ROBAXIN) 500 MG tablet, Take 1 tablet (500 mg total) by mouth every 6 (six) hours as needed for muscle spasms., Disp: 40 tablet, Rfl: 2   Multiple Vitamin (MULTIVITAMIN WITH  MINERALS) TABS tablet, Take 1 tablet by mouth daily., Disp: , Rfl:    oxybutynin (DITROPAN) 5 MG tablet, Take 1 tablet (5 mg total) by mouth at bedtime., Disp: 30 tablet, Rfl: 11   polyethylene glycol (MIRALAX / GLYCOLAX) 17 g packet, Take 17 g by mouth 2 (two) times daily., Disp: 14 each, Rfl: 0   traZODone (DESYREL) 100 MG tablet, Take 100 mg by mouth at bedtime., Disp: , Rfl:    methylphenidate (RITALIN) 20 MG tablet, Take 1 tablet (20 mg total) by mouth daily., Disp: 30 tablet, Rfl: 0   oxyCODONE (OXY IR/ROXICODONE) 5 MG immediate release tablet, Take 1 tablet (5 mg total) by mouth every 4 (four) hours as needed for severe pain. (Patient not taking: Reported on 10/31/2022), Disp: 42 tablet, Rfl: 0  PAST MEDICAL HISTORY: Past Medical History:  Diagnosis Date   Anxiety    Arthritis    Osteoarthritis- knees.   Depression    Multiple sclerosis (HCC)    Neuromuscular disorder (HCC)    Mulitple sclerosis   Stroke (HCC) 11/14/2020   Thalmic CVA    PAST SURGICAL HISTORY: Past Surgical History:  Procedure Laterality Date   BACK SURGERY     fusions x3 lumbar.   BREAST EXCISIONAL BIOPSY Left    CESAREAN SECTION     DENTAL SURGERY     extractions   TONSILLECTOMY     TOTAL HIP ARTHROPLASTY Left 07/23/2022   Procedure: TOTAL HIP ARTHROPLASTY ANTERIOR APPROACH;  Surgeon: Durene Romans, MD;  Location: [facility];  Service: Orthopedics;  Laterality: Left;   TOTAL KNEE ARTHROPLASTY Right 07/18/2015   Procedure: TOTAL KNEE ARTHROPLASTY;  Surgeon: Durene Romans, MD;  Location: [facility];  Service: Orthopedics;  Laterality: Right;   TUBAL LIGATION      FAMILY HISTORY: Family History  Problem Relation Age of Onset   CVA Mother    Brain cancer Father     SOCIAL HISTORY:  Social History   Socioeconomic History   Marital status: Married    Spouse name: Not on file   Number of children: Not on file   Years of education: Not on file   Highest education level: Not on file  Occupational History    Not on file  Tobacco Use   Smoking status: Former    Types: Cigarettes  Smokeless tobacco: Not on file   Tobacco comments:    social"-college years"  Vaping Use   Vaping Use: Never used  Substance and Sexual Activity   Alcohol use: No   Drug use: No   Sexual activity: Not Currently  Other Topics Concern   Not on file  Social History Narrative   Not on file   Social Determinants of Health   Financial Resource Strain: Not on file  Food Insecurity: No Food Insecurity (07/23/2022)   Hunger Vital Sign    Worried About Running Out of Food in the Last Year: Never true    Ran Out of Food in the Last Year: Never true  Transportation Needs: No Transportation Needs (07/23/2022)   PRAPARE - Administrator, Civil Service (Medical): No    Lack of Transportation (Non-Medical): No  Physical Activity: Not on file  Stress: Not on file  Social Connections: Not on file  Intimate Partner Violence: Not At Risk (07/23/2022)   Humiliation, Afraid, Rape, and Kick questionnaire    Fear of Current or Ex-Partner: No    Emotionally Abused: No    Physically Abused: No    Sexually Abused: No     PHYSICAL EXAM  Vitals:   10/31/22 1135  BP: 116/68  Pulse: 97  Weight: 138 lb 8 oz (62.8 kg)  Height: 4\' 11"  (1.499 m)    Body mass index is 27.97 kg/m.   General: The patient is well-developed and well-nourished and in no acute distress  HEENT:  Head is [place]/AT.  Sclera are anicteric.     Skin: Extremities are without rash or  edema.  Neurologic Exam  Mental status: The patient is alert and oriented x 3 at the time of the examination. The patient has apparent normal recent and remote memory, with an apparently normal attention span and concentration ability.   Speech is normal.  Cranial nerves: Extraocular movements are full.  Facial strength was normal.  No dysarthria.  No obvious hearing deficits are noted.  Motor:  Muscle bulk is normal.   Tone is normal. Strength is  5 / 5  in all 4 extremities.   Sensory: Sensory testing is intact to pinprick, soft touch and vibration sensation in all 4 extremities.  Coordination: Cerebellar testing reveals good finger-nose-finger and heel-to-shin bilaterally.  Gait and station: Station is normal.   She has an arthritic gait and is hunched over.  She can take steps without her cane but does better with that.  The tandem gait is very poor.  Romberg is negative.  Reflexes: Deep tendon reflexes are symmetric and normal bilaterally.        DIAGNOSTIC DATA (LABS, IMAGING, TESTING) - I reviewed patient records, labs, notes, testing and imaging myself where available.  Lab Results  Component Value Date   WBC 10.4 07/24/2022   HGB 10.0 (L) 07/24/2022   HCT 31.3 (L) 07/24/2022   MCV 96.0 07/24/2022   PLT 151 07/24/2022      Component Value Date/Time   NA 138 07/24/2022 0343   K 4.3 07/24/2022 0343   CL 103 07/24/2022 0343   CO2 26 07/24/2022 0343   GLUCOSE 124 (H) 07/24/2022 0343   BUN 14 07/24/2022 0343   CREATININE 0.66 07/24/2022 0343   CALCIUM 8.7 (L) 07/24/2022 0343   PROT 6.4 (L) 11/14/2020 0919   ALBUMIN 3.8 11/14/2020 0919   AST 24 11/14/2020 0919   ALT 24 11/14/2020 0919   ALKPHOS 80 11/14/2020 0919   BILITOT  0.4 11/14/2020 0919   GFRNONAA >60 07/24/2022 0343   GFRAA >60 07/19/2015 0351   Lab Results  Component Value Date   CHOL 187 11/15/2020   HDL 75 11/15/2020   LDLCALC 90 11/15/2020   TRIG 109 11/15/2020   CHOLHDL 2.5 11/15/2020   Lab Results  Component Value Date   HGBA1C 5.9 (H) 11/15/2020   No results found for: "VITAMINB12" Lab Results  Component Value Date   TSH 2.546 11/15/2020       ASSESSMENT AND PLAN  Multiple sclerosis (HCC) - Plan: MR BRAIN W WO CONTRAST  Cerebrovascular accident (CVA), unspecified mechanism (HCC) - Plan: MR BRAIN W WO CONTRAST  Numbness - Plan: MR BRAIN W WO CONTRAST  Thalamic stroke (HCC)  Attention deficit  Gait disturbance  We stopped the  Ocrevus in early 2023 and she has been clinically stable..  We will check an MRI of the brain to determine if there is any breakthrough activity.  If this is occurring we will reconsider starting a disease modifying therapy. Continue Ritalin and renew. Stay active and continue to walk/exercise.  Rtc 6 months or sooner if new or worsening issues.  Danta Baumgardner A. Epimenio Foot, MD, [facility] 10/31/2022, 4:54 PM Certified in Neurology, Clinical Neurophysiology, Sleep Medicine and Neuroimaging  [facility] [address] [place], [place] [address] [phone]

## 2022-10-31 NOTE — Telephone Encounter (Signed)
Ethlyn Gallery: [phone] exp. 10/31/22-11/30/22 sent to GI [phone]

## 2022-11-07 ENCOUNTER — Ambulatory Visit
Admission: RE | Admit: 2022-11-07 | Discharge: 2022-11-07 | Disposition: A | Discharge status: Home / Self Care | Payer: Medicare PPO | Source: Ambulatory Visit | Attending: Neurology | Admitting: Neurology

## 2022-11-07 DIAGNOSIS — G35 Multiple sclerosis: Secondary | ICD-10-CM

## 2022-11-07 DIAGNOSIS — R200 Anesthesia of skin: Secondary | ICD-10-CM

## 2022-11-07 DIAGNOSIS — I639 Cerebral infarction, unspecified: Secondary | ICD-10-CM

## 2022-11-07 MED ORDER — GADOPICLENOL 0.5 MMOL/ML IV SOLN
6.0000 mL | Freq: Once | INTRAVENOUS | Status: AC | PRN
  Administered 2022-11-07: 6 mL via INTRAVENOUS

## 2022-11-26 DIAGNOSIS — F419 Anxiety disorder, unspecified: Secondary | ICD-10-CM | POA: Diagnosis not present

## 2022-11-26 DIAGNOSIS — F429 Obsessive-compulsive disorder, unspecified: Secondary | ICD-10-CM | POA: Diagnosis not present

## 2022-11-26 DIAGNOSIS — F331 Major depressive disorder, recurrent, moderate: Secondary | ICD-10-CM | POA: Diagnosis not present

## 2022-12-03 ENCOUNTER — Other Ambulatory Visit: Payer: Self-pay | Admitting: Neurology

## 2022-12-03 MED ORDER — METHYLPHENIDATE HCL 20 MG PO TABS: 20.0000 mg | ORAL_TABLET | Freq: Every day | ORAL | 0 refills | Status: DC

## 2023-01-01 ENCOUNTER — Other Ambulatory Visit: Payer: Self-pay | Admitting: Neurology

## 2023-01-02 MED ORDER — METHYLPHENIDATE HCL 20 MG PO TABS: 20.0000 mg | ORAL_TABLET | Freq: Every day | ORAL | 0 refills | Status: DC

## 2023-01-07 ENCOUNTER — Other Ambulatory Visit: Payer: Self-pay | Admitting: Family Medicine

## 2023-01-07 DIAGNOSIS — Z1231 Encounter for screening mammogram for malignant neoplasm of breast: Secondary | ICD-10-CM

## 2023-02-03 ENCOUNTER — Other Ambulatory Visit: Payer: Self-pay | Admitting: Neurology

## 2023-02-03 MED ORDER — METHYLPHENIDATE HCL 20 MG PO TABS: 20.0000 mg | ORAL_TABLET | Freq: Every day | ORAL | 0 refills | Status: DC

## 2023-02-03 NOTE — Telephone Encounter (Signed)
Pt is requesting a refill for methylphenidate (RITALIN) 20 MG tablet t.  Pharmacy: [facility] ID: [id number]

## 2023-02-03 NOTE — Telephone Encounter (Signed)
Last seen on 10/31/22 Follow up scheduled on 05/15/23 Last filled on 01/02/23 #30 tablets (30 day supply) Rx pending to be signed

## 2023-02-05 DIAGNOSIS — L821 Other seborrheic keratosis: Secondary | ICD-10-CM | POA: Diagnosis not present

## 2023-02-05 DIAGNOSIS — L570 Actinic keratosis: Secondary | ICD-10-CM | POA: Diagnosis not present

## 2023-02-05 DIAGNOSIS — L218 Other seborrheic dermatitis: Secondary | ICD-10-CM | POA: Diagnosis not present

## 2023-02-06 ENCOUNTER — Ambulatory Visit: Payer: Medicare PPO

## 2023-02-18 DIAGNOSIS — F419 Anxiety disorder, unspecified: Secondary | ICD-10-CM | POA: Diagnosis not present

## 2023-02-18 DIAGNOSIS — F331 Major depressive disorder, recurrent, moderate: Secondary | ICD-10-CM | POA: Diagnosis not present

## 2023-02-18 DIAGNOSIS — F429 Obsessive-compulsive disorder, unspecified: Secondary | ICD-10-CM | POA: Diagnosis not present

## 2023-03-05 ENCOUNTER — Ambulatory Visit
Admission: RE | Admit: 2023-03-05 | Discharge: 2023-03-05 | Disposition: A | Discharge status: Home / Self Care | Payer: Medicare PPO | Source: Ambulatory Visit | Attending: Family Medicine | Admitting: Family Medicine

## 2023-03-05 ENCOUNTER — Other Ambulatory Visit: Payer: Self-pay | Admitting: Neurology

## 2023-03-05 DIAGNOSIS — Z1231 Encounter for screening mammogram for malignant neoplasm of breast: Secondary | ICD-10-CM

## 2023-03-05 MED ORDER — METHYLPHENIDATE HCL 20 MG PO TABS: 20.0000 mg | ORAL_TABLET | Freq: Every day | ORAL | 0 refills | Status: DC

## 2023-03-05 NOTE — Telephone Encounter (Signed)
Pt is needing a refill request for her  methylphenidate (RITALIN) 20 MG tablet to be sent [facility] to [facility] [address]

## 2023-03-05 NOTE — Telephone Encounter (Signed)
Last seen on 10/31/22 Follow up scheduled on 05/15/23 Last filled on 02/03/23 #30 tablets (30 day supply) Rx pending to be signed

## 2023-03-25 DIAGNOSIS — X32XXXA Exposure to sunlight, initial encounter: Secondary | ICD-10-CM | POA: Diagnosis not present

## 2023-03-25 DIAGNOSIS — L570 Actinic keratosis: Secondary | ICD-10-CM | POA: Diagnosis not present

## 2023-04-03 ENCOUNTER — Other Ambulatory Visit: Payer: Self-pay | Admitting: Neurology

## 2023-04-03 DIAGNOSIS — H40013 Open angle with borderline findings, low risk, bilateral: Secondary | ICD-10-CM | POA: Diagnosis not present

## 2023-04-03 DIAGNOSIS — H04123 Dry eye syndrome of bilateral lacrimal glands: Secondary | ICD-10-CM | POA: Diagnosis not present

## 2023-04-03 DIAGNOSIS — H2513 Age-related nuclear cataract, bilateral: Secondary | ICD-10-CM | POA: Diagnosis not present

## 2023-04-03 MED ORDER — METHYLPHENIDATE HCL 20 MG PO TABS: 20.0000 mg | ORAL_TABLET | Freq: Every day | ORAL | 0 refills | Status: DC

## 2023-04-03 NOTE — Telephone Encounter (Signed)
Pt is requesting a refill for methylphenidate (RITALIN) 20 MG tablet .  Pharm[facility]: [id number]

## 2023-04-03 NOTE — Telephone Encounter (Signed)
Last seen 10/31/22 and next f/u 05/15/23. Last refilled 03/07/23 #30.

## 2023-05-02 ENCOUNTER — Other Ambulatory Visit: Payer: Self-pay | Admitting: Neurology

## 2023-05-02 MED ORDER — METHYLPHENIDATE HCL 20 MG PO TABS: 20.0000 mg | ORAL_TABLET | Freq: Every day | ORAL | 0 refills | Status: DC

## 2023-05-02 NOTE — Telephone Encounter (Signed)
Pt requesting refill of methylphenidate (RITALIN) 20 MG tablet. [id number]

## 2023-05-02 NOTE — Telephone Encounter (Signed)
Dr. San Morelle can you signed Rx for Dr.Sater patient.   Last seen on 10/31/22 Follow up scheduled on 05/15/23 Last filled on 04/03/23 #30 tablets (30 day supply) Rx pending to be signed

## 2023-05-15 ENCOUNTER — Encounter: Payer: Self-pay | Admitting: Neurology

## 2023-05-15 ENCOUNTER — Ambulatory Visit: Payer: Medicare PPO | Admitting: Neurology

## 2023-05-15 VITALS — BP 129/77 | HR 62 | Ht 61.0 in | Wt 147.5 lb

## 2023-05-15 DIAGNOSIS — I6381 Other cerebral infarction due to occlusion or stenosis of small artery: Secondary | ICD-10-CM

## 2023-05-15 DIAGNOSIS — I639 Cerebral infarction, unspecified: Secondary | ICD-10-CM

## 2023-05-15 DIAGNOSIS — G35 Multiple sclerosis: Secondary | ICD-10-CM

## 2023-05-15 DIAGNOSIS — R200 Anesthesia of skin: Secondary | ICD-10-CM

## 2023-05-15 DIAGNOSIS — R269 Unspecified abnormalities of gait and mobility: Secondary | ICD-10-CM

## 2023-05-15 DIAGNOSIS — R41840 Attention and concentration deficit: Secondary | ICD-10-CM

## 2023-05-15 NOTE — Progress Notes (Signed)
 [facility]  PATIENT: Barbara Singleton DOB: 1950-12-28  REFERRING DOCTOR OR PCP: Prentice Lagos, DO [facility] neurology); Lamar Bernhardt MD (PCP) SOURCE: Patient, notes from [facility] neurology, imaging and lab reports, MRI images personally reviewed.  _________________________________   HISTORICAL  CHIEF COMPLAINT:  Chief Complaint  Patient presents with   Follow-up    Pt in room 11 alone.  Here for MS follow up. Pt reports doing well, no concern. No falls.     HISTORY OF PRESENT ILLNESS:  Barbara Singleton is a 73 y.o. woman with multiple sclerosis.  UPDATE 05/15/2023 She had been on Ocrevus  (last infusion was January 2023 but at the last visit we had decided to see how she does off of a disease modifying therapy as the risks may be higher than the benefit.  She has been diagnosed for more than 20 years and has had no exacerbations and her last several MRIs showed no new lesions.  She feels she is doing just as well of the medication as she did on the medication.  She feels her gait is mostly stable though slow worsening over the years.    She also has scoliosis.  She walks without her cane indoors.   With her cane she easily walks one hour each day close to 3 miles.   Balance is reduced .    She feels balance affects gait more than strength and legs feel strong and are not spastic. She uses the bannister on stairs.    She has right arm and leg numbness that is stable.    Some tingling in hands but no pain.        Her bladder function is about the same with mild nocturia and frequency.  Not on any medication as Oxybutynin  was associated with her urgency doing worse.        Vision is stable.   She feels she completely recovered from her stroke.   At the time she had diplopia and more right sided symptoms.    Vision is doing well.  No further diplopia.  She denies any major issues with her cognition.    She has some depression and is stable on medication (on  Luvox , lamotrigine , Latuda ).    She sleeps well most nights (takes trazodone ).   She has some fatigue but is fine overall.   The Ritalin  greatly helps her to function better and she  feels more alert and less fatigued.      History of MS: She was diagnosed with MS in 2001 after presenting with numbness in her arms, weakness in legs and multiple falls.  She reports having an MRI but is unsure if just the brain was done on the brain or spine was performed.  She also had a lumbar puncture but we do not have those results.  At the time she was seeing Dr. Juliane.     She was initially placed on Copaxone and stayed on it until 2021.   At that time, she was felt to have more lesions on her MRI and was started on Ocrevus .   She was not having any exacerbations or major change in disability.      History of stroke: She presented to the emergency room 11/14/2020 for right-sided ataxia and gait disturbance.  At that time, she was on aspirin .  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  CT angiogram of the head and neck was negative.  Ejection fraction was 55 to 60%.  LDL was 90.  She was placed on DAPT (Plavix  plus aspirin ) and then converted to Plavix  alone after several weeks.  Her atorvastatin  dose was increased from 10 mg to 40 mg.  She recovered almost completely while on the hospital and was back to baseline a few days later.     DATA Imaging: MRI of the head 11/14/2020 shows restricted diffusion consistent with an acute left medial thalamic infarction.  Additionally, scattered throughout the hemispheres on T2/FLAIR hyperintense foci in the periventricular, subcortical and deep white matter.  These are fairly nonspecific and could represent demyelination and/or chronic microvascular ischemic change.   I compared the MRI from 2022 to the MRI from 02/15/2008.  There are more white matter changes during that 13-year interim though the pattern is nonspecific and many of the  changes could be due to chronic microvascular ischemic disease  MRI brain 11/04/2021 showed scattered T2/FLAIR hyperintense foci in the subcortical and deep white matter of the cerebral hemispheres. A few foci are in the periventricular white matter. None of the foci appears to be acute and there are no new lesions compared to the 2022 MRI. Although some foci could be consistent with demyelination. The majority have an appearance more consistent with stable chronic microvascular ischemic change.  1 small focus in the medial left thalamus could be a small lacunar infarction.  MRI cervical spine 11/04/2021 showed that the spinal cord was  normal.  No demyelinating plaques noted.    At C3-C4, there are degenerative changes and very minimal retrolisthesis causing moderate right foraminal narrowing but no spinal stenosis or nerve root compression.   At C4-C5, there are degenerative changes causing borderline spinal stenosis and moderately severe left and moderate right foraminal narrowing.  There is potential for left C5 nerve root compression.   At C5-C6, there are degenerative changes causing minimal spinal stenosis and mild to moderate foraminal narrowing but no nerve root compression.  CT angiogram of the head and neck showed no stenosis.  Incidental note is made of a small vertebrobasilar system with fetal origins of both posterior cerebral arteries.  This is normal variant.  MRI 11/07/2022 brain showed Chronic lacunar infarction in the left thalamus was acute on the 11/14/2020 MRI.    Scattered T2/FLAIR hyperintense foci in the hemispheres, predominantly in the subcortical and deep white matter.  Most of the foci have an appearance most consistent with chronic microvascular ischemic change, though a few foci are in the periventricular and juxtacortical white matter and could be related to demyelination.  None of the foci enhance.  Compared to the MRI from 11/14/2020, there were no new lesions.  REVIEW OF  SYSTEMS: Constitutional: No fevers, chills, sweats, or change in appetite Eyes: No visual changes, double vision, eye pain Ear, nose and throat: No hearing loss, ear pain, nasal congestion, sore throat Cardiovascular: No chest pain, palpitations Respiratory:  No shortness of breath at rest or with exertion.   No wheezes GastrointestinaI: No nausea, vomiting, diarrhea, abdominal pain, fecal incontinence Genitourinary:  No dysuria, urinary retention or frequency.  No nocturia. Musculoskeletal:  No neck pain, back pain Integumentary: No rash, pruritus, skin lesions Neurological: as above Psychiatric: No depression at this time.  No anxiety Endocrine: No palpitations, diaphoresis, change in appetite, change in weigh or increased thirst Hematologic/Lymphatic:  No anemia, purpura, petechiae. Allergic/Immunologic: No itchy/runny eyes, nasal congestion, recent allergic reactions, rashes  ALLERGIES: Allergies  Allergen Reactions   Naproxen Other (See Comments)  HEADACHE    Other Anxiety    Antihistamines cause anxiety and jitteriness     HOME MEDICATIONS:  Current Outpatient Medications:    acetaminophen  (TYLENOL ) 500 MG tablet, Take 1,000 mg by mouth every 6 (six) hours as needed for moderate pain., Disp: , Rfl:    Ascorbic Acid  (VITAMIN C) 1000 MG tablet, Take 1,000 mg by mouth daily., Disp: , Rfl:    atorvastatin  (LIPITOR) 40 MG tablet, Take 1 tablet (40 mg total) by mouth daily., Disp: 30 tablet, Rfl: 0   b complex vitamins capsule, Take 1 capsule by mouth daily., Disp: , Rfl:    Calcium  Carb-Cholecalciferol  (CALCIUM  600 + D PO), Take 1-2 tablets by mouth See admin instructions. Take 2 tablets in the morning and 1 tablet at night, Disp: , Rfl:    clobetasol  (OLUX ) 0.05 % topical foam, Apply topically 2 (two) times daily. (Patient taking differently: Apply 1 Application topically 2 (two) times daily as needed (psoriasis).), Disp: 50 g, Rfl: 6   clopidogrel  (PLAVIX ) 75 MG tablet, Take 1  tablet (75 mg total) by mouth daily., Disp: 30 tablet, Rfl: 0   fluvoxaMINE  (LUVOX ) 100 MG tablet, Take 100 mg by mouth 3 (three) times daily. , Disp: , Rfl:    lamoTRIgine  (LAMICTAL ) 100 MG tablet, Take 100 mg by mouth 2 (two) times daily. , Disp: , Rfl:    LATUDA  80 MG TABS tablet, Take 80 mg by mouth daily., Disp: , Rfl:    methylphenidate  (RITALIN ) 20 MG tablet, Take 1 tablet (20 mg total) by mouth daily., Disp: 30 tablet, Rfl: 0   Multiple Vitamin (MULTIVITAMIN WITH MINERALS) TABS tablet, Take 1 tablet by mouth daily., Disp: , Rfl:    oxybutynin  (DITROPAN ) 5 MG tablet, Take 1 tablet (5 mg total) by mouth at bedtime., Disp: 30 tablet, Rfl: 11   polyethylene glycol (MIRALAX  / GLYCOLAX ) 17 g packet, Take 17 g by mouth 2 (two) times daily., Disp: 14 each, Rfl: 0   traZODone  (DESYREL ) 100 MG tablet, Take 100 mg by mouth at bedtime., Disp: , Rfl:    methocarbamol  (ROBAXIN ) 500 MG tablet, Take 1 tablet (500 mg total) by mouth every 6 (six) hours as needed for muscle spasms. (Patient not taking: Reported on 05/15/2023), Disp: 40 tablet, Rfl: 2   oxyCODONE  (OXY IR/ROXICODONE ) 5 MG immediate release tablet, Take 1 tablet (5 mg total) by mouth every 4 (four) hours as needed for severe pain. (Patient not taking: Reported on 05/15/2023), Disp: 42 tablet, Rfl: 0  PAST MEDICAL HISTORY: Past Medical History:  Diagnosis Date   Anxiety    Arthritis    Osteoarthritis- knees.   Depression    Multiple sclerosis (HCC)    Neuromuscular disorder (HCC)    Mulitple sclerosis   Stroke (HCC) 11/14/2020   Thalmic CVA    PAST SURGICAL HISTORY: Past Surgical History:  Procedure Laterality Date   BACK SURGERY     fusions x3 lumbar.   BREAST EXCISIONAL BIOPSY Left    CESAREAN SECTION     DENTAL SURGERY     extractions   TONSILLECTOMY     TOTAL HIP ARTHROPLASTY Left 07/23/2022   Procedure: TOTAL HIP ARTHROPLASTY ANTERIOR APPROACH;  Surgeon: Ernie Cough, MD;  Location: [facility];  Service: Orthopedics;  Laterality:  Left;   TOTAL KNEE ARTHROPLASTY Right 07/18/2015   Procedure: TOTAL KNEE ARTHROPLASTY;  Surgeon: Cough Ernie, MD;  Location: [facility];  Service: Orthopedics;  Laterality: Right;   TUBAL LIGATION      FAMILY  HISTORY: Family History  Problem Relation Age of Onset   CVA Mother    Brain cancer Father     SOCIAL HISTORY:  Social History   Socioeconomic History   Marital status: Married    Spouse name: Not on file   Number of children: Not on file   Years of education: Not on file   Highest education level: Not on file  Occupational History   Not on file  Tobacco Use   Smoking status: Former    Types: Cigarettes   Smokeless tobacco: Not on file   Tobacco comments:    social-college years  Vaping Use   Vaping status: Never Used  Substance and Sexual Activity   Alcohol use: No   Drug use: No   Sexual activity: Not Currently  Other Topics Concern   Not on file  Social History Narrative   Not on file   Social Drivers of Health   Financial Resource Strain: Not on file  Food Insecurity: No Food Insecurity (07/23/2022)   Hunger Vital Sign    Worried About Running Out of Food in the Last Year: Never true    Ran Out of Food in the Last Year: Never true  Transportation Needs: No Transportation Needs (07/23/2022)   PRAPARE - Administrator, Civil Service (Medical): No    Lack of Transportation (Non-Medical): No  Physical Activity: Not on file  Stress: Not on file  Social Connections: Not on file  Intimate Partner Violence: Not At Risk (07/23/2022)   Humiliation, Afraid, Rape, and Kick questionnaire    Fear of Current or Ex-Partner: No    Emotionally Abused: No    Physically Abused: No    Sexually Abused: No     PHYSICAL EXAM  Vitals:   05/15/23 1128  BP: 129/77  Pulse: 62  Weight: 147 lb 8 oz (66.9 kg)  Height: 5' 1 (1.549 m)    Body mass index is 27.87 kg/m.   General: The patient is well-developed and well-nourished and in no acute  distress  HEENT:  Head is [place]/AT.  Sclera are anicteric.     Skin: Extremities are without rash or  edema.  Neurologic Exam  Mental status: The patient is alert and oriented x 3 at the time of the examination. The patient has apparent normal recent and remote memory, with an apparently normal attention span and concentration ability.    Speech is normal  Cranial nerves: Extraocular movements are full.  Facial strength was normal.  No dysarthria.  No obvious hearing deficits are noted.  Motor:  Muscle bulk is normal.   Tone is normal. Strength is  5 / 5 in all 4 extremities.   Sensory: Sensory testing is intact to pinprick, soft touch and vibration sensation in all 4 extremities.  Coordination: Cerebellar testing reveals good finger-nose-finger and heel-to-shin bilaterally.  Gait and station: Station is normal.   She has an arthritic gait and is hunched over.  She is able to walk without her cane.  Does better with that.  The tandem gait is very poor.  Romberg is negative.  Reflexes: Deep tendon reflexes are symmetric and 1+  bilaterally in legs.        DIAGNOSTIC DATA (LABS, IMAGING, TESTING) - I reviewed patient records, labs, notes, testing and imaging myself where available.  Lab Results  Component Value Date   WBC 10.4 07/24/2022   HGB 10.0 (L) 07/24/2022   HCT 31.3 (L) 07/24/2022   MCV 96.0 07/24/2022  PLT 151 07/24/2022      Component Value Date/Time   NA 138 07/24/2022 0343   K 4.3 07/24/2022 0343   CL 103 07/24/2022 0343   CO2 26 07/24/2022 0343   GLUCOSE 124 (H) 07/24/2022 0343   BUN 14 07/24/2022 0343   CREATININE 0.66 07/24/2022 0343   CALCIUM  8.7 (L) 07/24/2022 0343   PROT 6.4 (L) 11/14/2020 0919   ALBUMIN  3.8 11/14/2020 0919   AST 24 11/14/2020 0919   ALT 24 11/14/2020 0919   ALKPHOS 80 11/14/2020 0919   BILITOT 0.4 11/14/2020 0919   GFRNONAA >60 07/24/2022 0343   GFRAA >60 07/19/2015 0351   Lab Results  Component Value Date   CHOL 187 11/15/2020    HDL 75 11/15/2020   LDLCALC 90 11/15/2020   TRIG 109 11/15/2020   CHOLHDL 2.5 11/15/2020   Lab Results  Component Value Date   HGBA1C 5.9 (H) 11/15/2020   No results found for: VITAMINB12 Lab Results  Component Value Date   TSH 2.546 11/15/2020       ASSESSMENT AND PLAN  Multiple sclerosis (HCC)  Cerebrovascular accident (CVA), unspecified mechanism (HCC)  Numbness  Thalamic stroke (HCC)  Attention deficit  Gait disturbance  We stopped the Ocrevus  in early 2023 and she has been clinically stable..  recheck MRI of the brain around end of 2025 or early 2026 to determine if there is any breakthrough activity.  If this is occurring we will reconsider starting a disease modifying therapy. Continue Ritalin   Stay active and continue to walk/exercise.  Rtc 6 months or sooner if new or worsening issues. This visit is part of a comprehensive longitudinal care medical relationship regarding the patients primary diagnosis of MS and related concerns.   Josselyne Onofrio A. Vear, MD, [facility] 05/15/2023, 12:01 PM Certified in Neurology, Clinical Neurophysiology, Sleep Medicine and Neuroimaging  [facility] [address] [place], [place] [address] [phone]

## 2023-06-03 ENCOUNTER — Other Ambulatory Visit: Payer: Self-pay | Admitting: Neurology

## 2023-06-03 MED ORDER — METHYLPHENIDATE HCL 20 MG PO TABS: 20.0000 mg | ORAL_TABLET | Freq: Every day | ORAL | 0 refills | Status: DC

## 2023-06-03 NOTE — Telephone Encounter (Signed)
Last seen 05/15/23 and next f/u 12/01/23.   Last refilled 05/02/23 #30.

## 2023-06-03 NOTE — Telephone Encounter (Signed)
 Pt is requesting a refill for methylphenidate (RITALIN) 20 MG tablet .  Pharmacy: [facility] ID: [id number]

## 2023-06-03 NOTE — Addendum Note (Signed)
Addended by: Arther Abbott on: 06/03/2023 03:01 PM   Modules accepted: Orders

## 2023-07-03 ENCOUNTER — Other Ambulatory Visit: Payer: Self-pay | Admitting: Neurology

## 2023-07-03 MED ORDER — METHYLPHENIDATE HCL 20 MG PO TABS: 20.0000 mg | ORAL_TABLET | Freq: Every day | ORAL | 0 refills | Status: DC

## 2023-07-03 NOTE — Telephone Encounter (Signed)
 Pt is requesting a refill for methylphenidate (RITALIN) 20 MG tablet .  Pharmacy: [facility] ID: [id number]

## 2023-07-31 ENCOUNTER — Other Ambulatory Visit: Payer: Self-pay | Admitting: Neurology

## 2023-07-31 MED ORDER — METHYLPHENIDATE HCL 20 MG PO TABS: 20.0000 mg | ORAL_TABLET | Freq: Every day | ORAL | 0 refills | Status: DC

## 2023-07-31 NOTE — Telephone Encounter (Signed)
 Last seen 05/15/23 Next appt scheduled 12/01/23  Dispenses   Dispensed Days Supply Quantity Provider Pharmacy  METHYLPHENIDATE 20 MG TABLET 07/03/2023 30 30 each Sater, Pearletha Furl, MD [id number]  METHYLPHENIDATE 20 MG TABLET 06/03/2023 30 30 each Sater, Pearletha Furl, MD [id number]  METHYLPHENIDATE 20 MG TABLET 05/02/2023 30 30 each Windell Norfolk, MD [id number]  METHYLPHENIDATE 20 MG TABLET 04/03/2023 30 30 each Sater, Pearletha Furl, MD [id number]  METHYLPHENIDATE 20 MG TABLET 03/07/2023 30 30 each Sater, Pearletha Furl, MD [id number]  METHYLPHENIDATE 20 MG TABLET 02/03/2023 30 30 each Sater, Pearletha Furl, MD [id number]  METHYLPHENIDATE 20 MG TABLET 01/02/2023 30 30 each Sater, Pearletha Furl, MD [facility] [phone] - C...  METHYLPHENIDATE 20 MG TABLET 12/03/2022 30 30 each Dohmeier, Porfirio Mylar, MD [facility] [phone] - C...  METHYLPHENIDATE 20 MG TABLET 11/05/2022 30 30 each Sater, Pearletha Furl, MD [facility] [phone] - C...  METHYLPHENIDATE 20 MG TABLET 10/08/2022 30 30 each Sater, Pearletha Furl, MD [facility] [phone] - C...  METHYLPHENIDATE 20 MG TABLET 08/31/2022 30 30 each Sater, Pearletha Furl, MD [facility] [phone] - C.Marland KitchenMarland Kitchen

## 2023-07-31 NOTE — Telephone Encounter (Signed)
 Pt is needing a refill on her methylphenidate (RITALIN) 20 MG tablet and is needing [facility] [address]

## 2023-09-03 ENCOUNTER — Other Ambulatory Visit: Payer: Self-pay | Admitting: Neurology

## 2023-09-03 MED ORDER — METHYLPHENIDATE HCL 20 MG PO TABS: 20.0000 mg | ORAL_TABLET | Freq: Every day | ORAL | 0 refills | Status: DC

## 2023-09-03 NOTE — Telephone Encounter (Signed)
 Last seen 05/15/23 and next f/u 12/01/23. Last refilled 08/03/23 #30.

## 2023-09-04 ENCOUNTER — Other Ambulatory Visit: Payer: Self-pay | Admitting: Family Medicine

## 2023-09-04 DIAGNOSIS — M8580 Other specified disorders of bone density and structure, unspecified site: Secondary | ICD-10-CM

## 2023-09-04 DIAGNOSIS — Z1231 Encounter for screening mammogram for malignant neoplasm of breast: Secondary | ICD-10-CM

## 2023-10-01 ENCOUNTER — Other Ambulatory Visit: Payer: Self-pay | Admitting: Neurology

## 2023-10-01 NOTE — Telephone Encounter (Signed)
 Dr.Ahern you are work in provider this pm Last seen on 05/15/23 Follow up scheduled on 12/01/23    Dispensed Days Supply Quantity Provider Pharmacy  METHYLPHENIDATE  20 MG TABLET 09/03/2023 30 30 each Sater, Sherida Dimmer, MD [id number]      Rx pending to be signed

## 2023-10-01 NOTE — Telephone Encounter (Signed)
 Pt is requesting a refill for methylphenidate (RITALIN) 20 MG tablet .  Pharmacy: [facility] ID: [id number]

## 2023-10-02 MED ORDER — METHYLPHENIDATE HCL 20 MG PO TABS: 20.0000 mg | ORAL_TABLET | Freq: Every day | ORAL | 0 refills | Status: DC

## 2023-11-03 ENCOUNTER — Other Ambulatory Visit: Payer: Self-pay | Admitting: Neurology

## 2023-11-03 MED ORDER — METHYLPHENIDATE HCL 20 MG PO TABS: 20.0000 mg | ORAL_TABLET | Freq: Every day | ORAL | 0 refills | Status: DC

## 2023-11-03 NOTE — Telephone Encounter (Signed)
 Pt is requesting a refill for methylphenidate (RITALIN) 20 MG tablet .  Pharmacy: [facility] ID: [id number]

## 2023-11-03 NOTE — Telephone Encounter (Signed)
 Last seen on 05/15/23 Follow up scheduled on 12/01/23   Dispensed Days Supply Quantity Provider Pharmacy  METHYLPHENIDATE  20 MG TABLET 10/03/2023 30 30 each Ines Onetha NOVAK, MD [id number]...     Rx pending to be signed

## 2023-12-01 ENCOUNTER — Ambulatory Visit: Payer: Medicare PPO | Admitting: Neurology

## 2023-12-01 ENCOUNTER — Encounter: Payer: Self-pay | Admitting: Neurology

## 2023-12-01 VITALS — BP 101/65 | HR 86 | Ht 61.0 in | Wt 157.5 lb

## 2023-12-01 DIAGNOSIS — E782 Mixed hyperlipidemia: Secondary | ICD-10-CM | POA: Insufficient documentation

## 2023-12-01 DIAGNOSIS — G35 Multiple sclerosis: Secondary | ICD-10-CM | POA: Diagnosis not present

## 2023-12-01 DIAGNOSIS — R269 Unspecified abnormalities of gait and mobility: Secondary | ICD-10-CM | POA: Diagnosis not present

## 2023-12-01 DIAGNOSIS — M8580 Other specified disorders of bone density and structure, unspecified site: Secondary | ICD-10-CM | POA: Insufficient documentation

## 2023-12-01 DIAGNOSIS — R7303 Prediabetes: Secondary | ICD-10-CM | POA: Insufficient documentation

## 2023-12-01 DIAGNOSIS — Z8673 Personal history of transient ischemic attack (TIA), and cerebral infarction without residual deficits: Secondary | ICD-10-CM | POA: Diagnosis not present

## 2023-12-01 DIAGNOSIS — R41840 Attention and concentration deficit: Secondary | ICD-10-CM

## 2023-12-01 DIAGNOSIS — R200 Anesthesia of skin: Secondary | ICD-10-CM

## 2023-12-01 DIAGNOSIS — F429 Obsessive-compulsive disorder, unspecified: Secondary | ICD-10-CM | POA: Insufficient documentation

## 2023-12-01 DIAGNOSIS — I6381 Other cerebral infarction due to occlusion or stenosis of small artery: Secondary | ICD-10-CM | POA: Diagnosis not present

## 2023-12-01 MED ORDER — METHYLPHENIDATE HCL 20 MG PO TABS: 20.0000 mg | ORAL_TABLET | Freq: Every day | ORAL | 0 refills | Status: DC

## 2023-12-01 NOTE — Progress Notes (Signed)
 [facility]  PATIENT: Barbara Singleton DOB: 1950-06-24  REFERRING DOCTOR OR PCP: Prentice Lagos, DO [facility] neurology); Lamar Bernhardt MD (PCP) SOURCE: Patient, notes from [facility] neurology, imaging and lab reports, MRI images personally reviewed.  _________________________________   HISTORICAL  CHIEF COMPLAINT:  Chief Complaint  Patient presents with   RM11/MS    Pt is here Alone. Pt states that everything has been going good since her last appointment.     HISTORY OF PRESENT ILLNESS:  Barbara Singleton is a 73 y.o. woman with multiple sclerosis.  UPDATE 12/01/2023 She stopped Ocrevus  January 2023   She has been diagnosed for more than 20 years and has had no exacerbations and her last several MRIs showed no new lesions.  Though some of the foci have an appearance consistent with demyelination, others look more like chronic microvascular ischemic change.  She feels she is doing just as well of the medication as she did on the medication.  She has a reduced gait and uses a cane.   Balance is ofd and affects her more than reduced strength.   No falls.    She also has scoliosis/LBP.  She walks without her cane indoors.   With her cane she easily walks one hour each day close to 3 miles.   Balance is reduced .    SABRA She uses the bannister on stairs.  Arm strength is fine.  Coordination is fine though arthritis affects some task some.   She has right arm and leg numbness that is stable.     Her bladder function is about the same with mild nocturia and frequency.  Not on any medication as Oxybutynin  was associated with her urgency doing worse.        Vision is stable.   She had a stroe in the past and had diplopia and more right sided symptoms.    This resolved  Vision is doing well.  No further diplopia.  She denies any major issues with her cognition.    She has h/o depression and is stable on medication (on Luvox , lamotrigine , Latuda ).  Much better with her  med's.    She sleeps well most nights (takes trazodone ).   She has some fatigue but is fine overall.   The Ritalin  greatly helps her to function better and she  feels more alert and less fatigued.      History of MS: She was diagnosed with MS in 2001 after presenting with numbness in her arms, weakness in legs and multiple falls.  She reports having an MRI but is unsure if just the brain was done on the brain or spine was performed.  She also had a lumbar puncture but we do not have those results.  At the time she was seeing Dr. Juliane.     She was initially placed on Copaxone and stayed on it until 2021.   At that time, she was felt to have more lesions on her MRI and was started on Ocrevus .   She was not having any exacerbations or major change in disability.      History of stroke: She presented to the emergency room 11/14/2020 for right-sided ataxia and gait disturbance.  At that time, she was on aspirin .  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  CT angiogram of the head and neck was negative.  Ejection fraction was 55 to 60%.  LDL was 90.  She was placed  on DAPT (Plavix  plus aspirin ) and then converted to Plavix  alone after several weeks.  Her atorvastatin  dose was increased from 10 mg to 40 mg.  She recovered almost completely while on the hospital and was back to baseline a few days later.     DATA Imaging: MRI of the head 11/14/2020 shows restricted diffusion consistent with an acute left medial thalamic infarction.  Additionally, scattered throughout the hemispheres on T2/FLAIR hyperintense foci in the periventricular, subcortical and deep white matter.  These are fairly nonspecific and could represent demyelination and/or chronic microvascular ischemic change.   I compared the MRI from 2022 to the MRI from 02/15/2008.  There are more white matter changes during that 13-year interim though the pattern is nonspecific and many of the changes could be due to  chronic microvascular ischemic disease  MRI brain 11/04/2021 showed scattered T2/FLAIR hyperintense foci in the subcortical and deep white matter of the cerebral hemispheres. A few foci are in the periventricular white matter. None of the foci appears to be acute and there are no new lesions compared to the 2022 MRI. Although some foci could be consistent with demyelination. The majority have an appearance more consistent with stable chronic microvascular ischemic change.  1 small focus in the medial left thalamus could be a small lacunar infarction.  MRI cervical spine 11/04/2021 showed that the spinal cord was  normal.  No demyelinating plaques noted.    At C3-C4, there are degenerative changes and very minimal retrolisthesis causing moderate right foraminal narrowing but no spinal stenosis or nerve root compression.   At C4-C5, there are degenerative changes causing borderline spinal stenosis and moderately severe left and moderate right foraminal narrowing.  There is potential for left C5 nerve root compression.   At C5-C6, there are degenerative changes causing minimal spinal stenosis and mild to moderate foraminal narrowing but no nerve root compression.  CT angiogram of the head and neck showed no stenosis.  Incidental note is made of a small vertebrobasilar system with fetal origins of both posterior cerebral arteries.  This is normal variant.  MRI 11/07/2022 brain showed Chronic lacunar infarction in the left thalamus was acute on the 11/14/2020 MRI.    Scattered T2/FLAIR hyperintense foci in the hemispheres, predominantly in the subcortical and deep white matter.  Most of the foci have an appearance most consistent with chronic microvascular ischemic change, though a few foci are in the periventricular and juxtacortical white matter and could be related to demyelination.  None of the foci enhance.  Compared to the MRI from 11/14/2020, there were no new lesions.  REVIEW OF SYSTEMS: Constitutional: No  fevers, chills, sweats, or change in appetite Eyes: No visual changes, double vision, eye pain Ear, nose and throat: No hearing loss, ear pain, nasal congestion, sore throat Cardiovascular: No chest pain, palpitations Respiratory:  No shortness of breath at rest or with exertion.   No wheezes GastrointestinaI: No nausea, vomiting, diarrhea, abdominal pain, fecal incontinence Genitourinary:  No dysuria, urinary retention or frequency.  No nocturia. Musculoskeletal:  No neck pain, back pain Integumentary: No rash, pruritus, skin lesions Neurological: as above Psychiatric: No depression at this time.  No anxiety Endocrine: No palpitations, diaphoresis, change in appetite, change in weigh or increased thirst Hematologic/Lymphatic:  No anemia, purpura, petechiae. Allergic/Immunologic: No itchy/runny eyes, nasal congestion, recent allergic reactions, rashes  ALLERGIES: Allergies  Allergen Reactions   Naproxen Other (See Comments)    HEADACHE    Other Anxiety    Antihistamines cause anxiety  and jitteriness     HOME MEDICATIONS:  Current Outpatient Medications:    Ascorbic Acid  (VITAMIN C) 1000 MG tablet, Take 1,000 mg by mouth daily., Disp: , Rfl:    atorvastatin  (LIPITOR) 40 MG tablet, Take 1 tablet (40 mg total) by mouth daily., Disp: 30 tablet, Rfl: 0   b complex vitamins capsule, Take 1 capsule by mouth daily., Disp: , Rfl:    Calcium  Carb-Cholecalciferol  (CALCIUM  600 + D PO), Take 1-2 tablets by mouth See admin instructions. Take 2 tablets in the morning and 1 tablet at night, Disp: , Rfl:    clobetasol  (OLUX ) 0.05 % topical foam, Apply topically 2 (two) times daily. (Patient taking differently: Apply 1 Application topically 2 (two) times daily as needed (psoriasis).), Disp: 50 g, Rfl: 6   clopidogrel  (PLAVIX ) 75 MG tablet, Take 1 tablet (75 mg total) by mouth daily., Disp: 30 tablet, Rfl: 0   fluvoxaMINE  (LUVOX ) 100 MG tablet, Take 100 mg by mouth 3 (three) times daily. , Disp: ,  Rfl:    lamoTRIgine  (LAMICTAL ) 100 MG tablet, Take 100 mg by mouth 2 (two) times daily. , Disp: , Rfl:    LATUDA  80 MG TABS tablet, Take 80 mg by mouth daily., Disp: , Rfl:    Multiple Vitamin (MULTIVITAMIN WITH MINERALS) TABS tablet, Take 1 tablet by mouth daily., Disp: , Rfl:    oxybutynin  (DITROPAN ) 5 MG tablet, Take 1 tablet (5 mg total) by mouth at bedtime., Disp: 30 tablet, Rfl: 11   polyethylene glycol (MIRALAX  / GLYCOLAX ) 17 g packet, Take 17 g by mouth 2 (two) times daily., Disp: 14 each, Rfl: 0   traZODone  (DESYREL ) 100 MG tablet, Take 100 mg by mouth at bedtime., Disp: , Rfl:    acetaminophen  (TYLENOL ) 500 MG tablet, Take 1,000 mg by mouth every 6 (six) hours as needed for moderate pain. (Patient not taking: Reported on 12/01/2023), Disp: , Rfl:    methocarbamol  (ROBAXIN ) 500 MG tablet, Take 1 tablet (500 mg total) by mouth every 6 (six) hours as needed for muscle spasms. (Patient not taking: Reported on 12/01/2023), Disp: 40 tablet, Rfl: 2   methylphenidate  (RITALIN ) 20 MG tablet, Take 1 tablet (20 mg total) by mouth daily., Disp: 30 tablet, Rfl: 0   oxyCODONE  (OXY IR/ROXICODONE ) 5 MG immediate release tablet, Take 1 tablet (5 mg total) by mouth every 4 (four) hours as needed for severe pain. (Patient not taking: Reported on 12/01/2023), Disp: 42 tablet, Rfl: 0  PAST MEDICAL HISTORY: Past Medical History:  Diagnosis Date   Anxiety    Arthritis    Osteoarthritis- knees.   Depression    Multiple sclerosis (HCC)    Neuromuscular disorder (HCC)    Mulitple sclerosis   Stroke (HCC) 11/14/2020   Thalmic CVA    PAST SURGICAL HISTORY: Past Surgical History:  Procedure Laterality Date   BACK SURGERY     fusions x3 lumbar.   BREAST EXCISIONAL BIOPSY Left    CESAREAN SECTION     DENTAL SURGERY     extractions   TONSILLECTOMY     TOTAL HIP ARTHROPLASTY Left 07/23/2022   Procedure: TOTAL HIP ARTHROPLASTY ANTERIOR APPROACH;  Surgeon: Ernie Cough, MD;  Location: [facility];  Service:  Orthopedics;  Laterality: Left;   TOTAL KNEE ARTHROPLASTY Right 07/18/2015   Procedure: TOTAL KNEE ARTHROPLASTY;  Surgeon: Cough Ernie, MD;  Location: [facility];  Service: Orthopedics;  Laterality: Right;   TUBAL LIGATION      FAMILY HISTORY: Family History  Problem Relation  Age of Onset   CVA Mother    Brain cancer Father     SOCIAL HISTORY:  Social History   Socioeconomic History   Marital status: Married    Spouse name: Not on file   Number of children: Not on file   Years of education: Not on file   Highest education level: Not on file  Occupational History   Not on file  Tobacco Use   Smoking status: Former    Types: Cigarettes   Smokeless tobacco: Not on file   Tobacco comments:    social-college years  Vaping Use   Vaping status: Never Used  Substance and Sexual Activity   Alcohol use: No   Drug use: No   Sexual activity: Not Currently  Other Topics Concern   Not on file  Social History Narrative   Not on file   Social Drivers of Health   Financial Resource Strain: Not on file  Food Insecurity: No Food Insecurity (07/23/2022)   Hunger Vital Sign    Worried About Running Out of Food in the Last Year: Never true    Ran Out of Food in the Last Year: Never true  Transportation Needs: No Transportation Needs (07/23/2022)   PRAPARE - Administrator, Civil Service (Medical): No    Lack of Transportation (Non-Medical): No  Physical Activity: Not on file  Stress: Not on file  Social Connections: Not on file  Intimate Partner Violence: Not At Risk (07/23/2022)   Humiliation, Afraid, Rape, and Kick questionnaire    Fear of Current or Ex-Partner: No    Emotionally Abused: No    Physically Abused: No    Sexually Abused: No     PHYSICAL EXAM  Vitals:   12/01/23 1146  BP: 101/65  Pulse: 86  SpO2: 98%  Weight: 157 lb 8 oz (71.4 kg)  Height: 5' 1 (1.549 m)    Body mass index is 29.76 kg/m.   General: The patient is well-developed and  well-nourished and in no acute distress  HEENT:  Head is [place]/AT.  Sclera are anicteric.     Skin: Extremities are without rash or  edema.  Neurologic Exam  Mental status: The patient is alert and oriented x 3 at the time of the examination. The patient has apparent normal recent and remote memory, with an apparently normal attention span and concentration ability.    Speech is normal  Cranial nerves: Extraocular movements are full.  Facial strength was normal.  No dysarthria.  No obvious hearing deficits are noted.  Motor:  Muscle bulk is normal.   Tone is normal. Strength is  5 / 5 in all 4 extremities.   Sensory: Sensory testing is intact to pinprick, soft touch and vibration sensation in all 4 extremities.  Coordination: Cerebellar testing reveals good finger-nose-finger and heel-to-shin bilaterally.  Gait and station: Station is normal.   She has an arthritic gait and is hunched over.  Very mild right foot drop.  She is able to walk without her cane.  Does better with it though.  The tandem gait is very poor.  Romberg is negative.  Reflexes: Deep tendon reflexes are symmetric and 1+  bilaterally in legs.        DIAGNOSTIC DATA (LABS, IMAGING, TESTING) - I reviewed patient records, labs, notes, testing and imaging myself where available.  Lab Results  Component Value Date   WBC 10.4 07/24/2022   HGB 10.0 (L) 07/24/2022   HCT 31.3 (L) 07/24/2022  MCV 96.0 07/24/2022   PLT 151 07/24/2022      Component Value Date/Time   NA 138 07/24/2022 0343   K 4.3 07/24/2022 0343   CL 103 07/24/2022 0343   CO2 26 07/24/2022 0343   GLUCOSE 124 (H) 07/24/2022 0343   BUN 14 07/24/2022 0343   CREATININE 0.66 07/24/2022 0343   CALCIUM  8.7 (L) 07/24/2022 0343   PROT 6.4 (L) 11/14/2020 0919   ALBUMIN  3.8 11/14/2020 0919   AST 24 11/14/2020 0919   ALT 24 11/14/2020 0919   ALKPHOS 80 11/14/2020 0919   BILITOT 0.4 11/14/2020 0919   GFRNONAA >60 07/24/2022 0343   GFRAA >60 07/19/2015 0351    Lab Results  Component Value Date   CHOL 187 11/15/2020   HDL 75 11/15/2020   LDLCALC 90 11/15/2020   TRIG 109 11/15/2020   CHOLHDL 2.5 11/15/2020   Lab Results  Component Value Date   HGBA1C 5.9 (H) 11/15/2020   No results found for: [id number] Lab Results  Component Value Date   TSH 2.546 11/15/2020       ASSESSMENT AND PLAN  Multiple sclerosis (HCC) - Plan: MR BRAIN W WO CONTRAST  History of stroke without residual deficits - Plan: MR BRAIN W WO CONTRAST  Gait disturbance - Plan: MR BRAIN W WO CONTRAST  Thalamic stroke (HCC)  Numbness  Attention deficit  We stopped the Ocrevus  in early 2023 and she has been clinically stable..  recheck MRI of the brain around end of 2025 or early 2026 to determine if there is any breakthrough activity.  Most of her foci were likely ischemic.  If this is occurring we will reconsider starting a disease modifying therapy. Continue Ritalin  for for ADD/fatigue Stay active and continue to walk/exercise.  Rtc 6 months or sooner if new or worsening issues.  This visit is part of a comprehensive longitudinal care medical relationship regarding the patients primary diagnosis of MS and related concerns.   Brock Mokry A. Vear, MD, [facility] 12/01/2023, 3:43 PM Certified in Neurology, Clinical Neurophysiology, Sleep Medicine and Neuroimaging  [facility] [address], [address] [place], [place] [address] [phone]

## 2023-12-03 ENCOUNTER — Telehealth: Payer: Self-pay | Admitting: Neurology

## 2023-12-03 NOTE — Telephone Encounter (Signed)
 Mylene barrows: [id number] exp. 12/03/23-02/01/24 sent to GI [phone]

## 2023-12-28 ENCOUNTER — Ambulatory Visit
Admission: RE | Admit: 2023-12-28 | Discharge: 2023-12-28 | Disposition: A | Discharge status: Home / Self Care | Source: Ambulatory Visit | Attending: Neurology | Admitting: Neurology

## 2023-12-28 ENCOUNTER — Ambulatory Visit: Payer: Self-pay | Admitting: Neurology

## 2023-12-28 DIAGNOSIS — G35 Multiple sclerosis: Secondary | ICD-10-CM | POA: Diagnosis not present

## 2023-12-28 DIAGNOSIS — R269 Unspecified abnormalities of gait and mobility: Secondary | ICD-10-CM

## 2023-12-28 DIAGNOSIS — Z8673 Personal history of transient ischemic attack (TIA), and cerebral infarction without residual deficits: Secondary | ICD-10-CM

## 2023-12-28 MED ORDER — GADOPICLENOL 0.5 MMOL/ML IV SOLN
7.0000 mL | Freq: Once | INTRAVENOUS | Status: AC | PRN
  Administered 2023-12-28: 7 mL via INTRAVENOUS

## 2023-12-31 ENCOUNTER — Other Ambulatory Visit: Payer: Self-pay | Admitting: Neurology

## 2023-12-31 MED ORDER — METHYLPHENIDATE HCL 20 MG PO TABS: 20.0000 mg | ORAL_TABLET | Freq: Every day | ORAL | 0 refills | Status: DC

## 2023-12-31 NOTE — Telephone Encounter (Signed)
 Pt called needing a refill request for her  methylphenidate  (RITALIN ) 20 MG tablet sent in to the [facility] on [address].

## 2023-12-31 NOTE — Telephone Encounter (Signed)
 Last seen on 12/01/23 Follow up scheduled on 07/05/24   Dispensed Days Supply Quantity Provider Pharmacy  METHYLPHENIDATE  20 MG TABLET 12/01/2023 30 30 each Singleton, Barbara LABOR, MD [id number]     Rx pending to be signed

## 2024-01-29 ENCOUNTER — Other Ambulatory Visit: Payer: Self-pay | Admitting: Neurology

## 2024-01-29 MED ORDER — METHYLPHENIDATE HCL 20 MG PO TABS: 20.0000 mg | ORAL_TABLET | Freq: Every day | ORAL | 0 refills | Status: DC

## 2024-01-29 NOTE — Telephone Encounter (Signed)
 Pt is requesting a refill for methylphenidate (RITALIN) 20 MG tablet .  Pharmacy: [facility] ID: [id number]

## 2024-01-29 NOTE — Telephone Encounter (Signed)
   Last seen 12/01/23 by Dr.Sater. Next f/u scheduled for 07/05/24. Last refilled 12/31/23 #30.

## 2024-03-01 ENCOUNTER — Other Ambulatory Visit: Payer: Self-pay | Admitting: Neurology

## 2024-03-01 MED ORDER — METHYLPHENIDATE HCL 20 MG PO TABS: 20.0000 mg | ORAL_TABLET | Freq: Every day | ORAL | 0 refills | Status: DC

## 2024-03-01 NOTE — Telephone Encounter (Signed)
 Pt called to request medication refill methylphenidate  (RITALIN ) 20 MG tablet   Pt medication is to be sent to   [id number] [address], [place] - [address] (Ph: [phone])

## 2024-03-01 NOTE — Telephone Encounter (Signed)
 Last seen on 12/01/23 Follow up scheduled on 07/05/24    Dispensed Days Supply Quantity Provider Pharmacy  METHYLPHENIDATE  20 MG TABLET 01/29/2024 30  Buck Saucer, MD [id number]     Rx pending to be signed

## 2024-03-05 ENCOUNTER — Ambulatory Visit
Admission: RE | Admit: 2024-03-05 | Discharge: 2024-03-05 | Disposition: A | Discharge status: Home / Self Care | Source: Ambulatory Visit | Attending: Family Medicine | Admitting: Family Medicine

## 2024-03-05 DIAGNOSIS — Z1231 Encounter for screening mammogram for malignant neoplasm of breast: Secondary | ICD-10-CM

## 2024-03-30 ENCOUNTER — Other Ambulatory Visit: Payer: Self-pay | Admitting: Neurology

## 2024-03-30 MED ORDER — METHYLPHENIDATE HCL 20 MG PO TABS: 20.0000 mg | ORAL_TABLET | Freq: Every day | ORAL | 0 refills | Status: DC

## 2024-03-30 NOTE — Telephone Encounter (Signed)
 Patient request refill for methylphenidate  (RITALIN ) 20 MG tablet send to  [id number]

## 2024-03-30 NOTE — Telephone Encounter (Signed)
 Requested Prescriptions   Pending Prescriptions Disp Refills   methylphenidate  (RITALIN ) 20 MG tablet 30 tablet 0    Sig: Take 1 tablet (20 mg total) by mouth daily.   Last seen 12/01/23 Next appt 07/05/24 Dispenses   Dispensed Days Supply Quantity Provider Pharmacy  METHYLPHENIDATE  20 MG TABLET 03/01/2024 30 30 each Sater, Charlie LABOR, MD [id number]  METHYLPHENIDATE  20 MG TABLET 01/29/2024 30 30 each Athar, Saima, MD [id number]  METHYLPHENIDATE  20 MG TABLET 12/31/2023 30 30 each Sater, Charlie LABOR, MD [id number]  METHYLPHENIDATE  20 MG TABLET 12/01/2023 30 30 each Sater, Charlie LABOR, MD [id number]  METHYLPHENIDATE  20 MG TABLET 11/03/2023 30 30 each Sater, Charlie LABOR, MD [id number]  METHYLPHENIDATE  20 MG TABLET 10/03/2023 30 30 each Ines Onetha NOVAK, MD [id number]  METHYLPHENIDATE  20 MG TABLET 09/03/2023 30 30 each Sater, Charlie LABOR, MD [id number]  METHYLPHENIDATE  20 MG TABLET 08/03/2023 30 30 each Sater, Charlie LABOR, MD [id number]  METHYLPHENIDATE  20 MG TABLET 07/03/2023 30 30 each Sater, Charlie LABOR, MD [id number]  METHYLPHENIDATE  20 MG TABLET 06/03/2023 30 30 each Sater, Charlie LABOR, MD [id number]  METHYLPHENIDATE  20 MG TABLET 05/02/2023 30 30 each Camara, Amadou, MD [id number]

## 2024-04-29 ENCOUNTER — Other Ambulatory Visit: Payer: Self-pay | Admitting: Neurology

## 2024-04-29 MED ORDER — METHYLPHENIDATE HCL 20 MG PO TABS: 20.0000 mg | ORAL_TABLET | Freq: Every day | ORAL | 0 refills | Status: DC

## 2024-04-29 NOTE — Telephone Encounter (Signed)
 Requested Prescriptions   Pending Prescriptions Disp Refills   methylphenidate  (RITALIN ) 20 MG tablet 30 tablet 0    Sig: Take 1 tablet (20 mg total) by mouth daily.   Last seen 12/01/23 Next appt 07/05/24  Dispenses   Dispensed Days Supply Quantity Provider Pharmacy  METHYLPHENIDATE  20 MG TABLET 03/30/2024 30 30 each Sater, Charlie LABOR, MD [id number]  METHYLPHENIDATE  20 MG TABLET 03/01/2024 30 30 each Sater, Charlie LABOR, MD [id number]  METHYLPHENIDATE  20 MG TABLET 01/29/2024 30 30 each Athar, Saima, MD [id number]  METHYLPHENIDATE  20 MG TABLET 12/31/2023 30 30 each Sater, Charlie LABOR, MD [id number]  METHYLPHENIDATE  20 MG TABLET 12/01/2023 30 30 each Sater, Charlie LABOR, MD [id number]  METHYLPHENIDATE  20 MG TABLET 11/03/2023 30 30 each Sater, Charlie LABOR, MD [id number]  METHYLPHENIDATE  20 MG TABLET 10/03/2023 30 30 each Ines Onetha NOVAK, MD [id number]  METHYLPHENIDATE  20 MG TABLET 09/03/2023 30 30 each Sater, Charlie LABOR, MD [id number]  METHYLPHENIDATE  20 MG TABLET 08/03/2023 30 30 each Sater, Charlie LABOR, MD [id number]  METHYLPHENIDATE  20 MG TABLET 07/03/2023 30 30 each Sater, Charlie LABOR, MD [id number]  METHYLPHENIDATE  20 MG TABLET 06/03/2023 30 30 each Sater, Charlie LABOR, MD [id number]

## 2024-04-29 NOTE — Telephone Encounter (Signed)
 Patient request refill for methylphenidate  (RITALIN ) 20 MG tablet send to  [id number]

## 2024-05-21 ENCOUNTER — Other Ambulatory Visit

## 2024-05-31 ENCOUNTER — Other Ambulatory Visit: Payer: Self-pay | Admitting: Neurology

## 2024-05-31 MED ORDER — METHYLPHENIDATE HCL 20 MG PO TABS: 20.0000 mg | ORAL_TABLET | Freq: Every day | ORAL | 0 refills | Status: AC

## 2024-05-31 NOTE — Telephone Encounter (Signed)
 Pt called to request medication refill methylphenidate  (RITALIN ) 20 MG tablet    Pt medication is to be sent to  [id number] [address], [place] - [address] Phone: [phone]  Fax: [phone]

## 2024-05-31 NOTE — Telephone Encounter (Signed)
 Requested Prescriptions   Pending Prescriptions Disp Refills   methylphenidate  (RITALIN ) 20 MG tablet 30 tablet 0    Sig: Take 1 tablet (20 mg total) by mouth daily.   Last seen 12/01/23 Next appt 07/05/24  Dispenses   Dispensed Days Supply Quantity Provider Pharmacy  METHYLPHENIDATE  20 MG TABLET 04/29/2024 30 30 each Sater, Charlie LABOR, MD [id number]  METHYLPHENIDATE  20 MG TABLET 03/30/2024 30 30 each Sater, Charlie LABOR, MD [id number]  METHYLPHENIDATE  20 MG TABLET 03/01/2024 30 30 each Sater, Charlie LABOR, MD [id number]  METHYLPHENIDATE  20 MG TABLET 01/29/2024 30 30 each Athar, Saima, MD [id number]  METHYLPHENIDATE  20 MG TABLET 12/31/2023 30 30 each Sater, Charlie LABOR, MD [id number]  METHYLPHENIDATE  20 MG TABLET 12/01/2023 30 30 each Sater, Charlie LABOR, MD [id number]  METHYLPHENIDATE  20 MG TABLET 11/03/2023 30 30 each Sater, Charlie LABOR, MD [id number]  METHYLPHENIDATE  20 MG TABLET 10/03/2023 30 30 each Ines Onetha NOVAK, MD [id number]  METHYLPHENIDATE  20 MG TABLET 09/03/2023 30 30 each Sater, Charlie LABOR, MD [id number]  METHYLPHENIDATE  20 MG TABLET 08/03/2023 30 30 each Sater, Charlie LABOR, MD [id number]  METHYLPHENIDATE  20 MG TABLET 07/03/2023 30 30 each Sater, Charlie LABOR, MD [id number]

## 2024-07-05 ENCOUNTER — Ambulatory Visit: Admitting: Neurology
